# Patient Record
Sex: Male | Born: 1955 | Hispanic: No | Marital: Single | Smoking: Former smoker
Health system: Southern US, Community
[De-identification: ages and names within clinical notes are randomized; demographics above are authoritative.]

## PROBLEM LIST (undated history)

## (undated) DIAGNOSIS — E119 Type 2 diabetes mellitus without complications: Secondary | ICD-10-CM

## (undated) HISTORY — PX: NO PAST SURGERIES: SHX2092

---

## 2000-11-28 ENCOUNTER — Encounter: Payer: Self-pay | Admitting: Internal Medicine

## 2000-11-28 ENCOUNTER — Ambulatory Visit: Admission: RE | Admit: 2000-11-28 | Discharge: 2000-11-28 | Payer: Self-pay

## 2000-12-10 ENCOUNTER — Ambulatory Visit (HOSPITAL_COMMUNITY): Admission: RE | Admit: 2000-12-10 | Discharge: 2000-12-10 | Payer: Self-pay | Admitting: Internal Medicine

## 2000-12-10 ENCOUNTER — Encounter: Payer: Self-pay | Admitting: Internal Medicine

## 2000-12-12 ENCOUNTER — Encounter: Payer: Self-pay | Admitting: Internal Medicine

## 2000-12-12 ENCOUNTER — Ambulatory Visit (HOSPITAL_COMMUNITY): Admission: RE | Admit: 2000-12-12 | Discharge: 2000-12-12 | Payer: Self-pay | Admitting: Internal Medicine

## 2009-02-07 ENCOUNTER — Emergency Department (HOSPITAL_COMMUNITY): Admission: EM | Admit: 2009-02-07 | Discharge: 2009-02-07 | Payer: Self-pay | Admitting: Family Medicine

## 2009-03-09 ENCOUNTER — Emergency Department (HOSPITAL_COMMUNITY): Admission: EM | Admit: 2009-03-09 | Discharge: 2009-03-10 | Payer: Self-pay | Admitting: Family Medicine

## 2009-05-28 ENCOUNTER — Ambulatory Visit: Payer: Self-pay | Admitting: Family Medicine

## 2009-05-28 ENCOUNTER — Encounter: Payer: Self-pay | Admitting: Family Medicine

## 2009-05-28 DIAGNOSIS — E1165 Type 2 diabetes mellitus with hyperglycemia: Secondary | ICD-10-CM

## 2009-05-28 LAB — CONVERTED CEMR LAB
Hgb A1c MFr Bld: 8.4 %
PSA: 0.53 ng/mL (ref 0.10–4.00)

## 2009-10-28 ENCOUNTER — Emergency Department (HOSPITAL_COMMUNITY): Admission: EM | Admit: 2009-10-28 | Discharge: 2009-10-28 | Payer: Self-pay | Admitting: Emergency Medicine

## 2011-02-12 ENCOUNTER — Encounter: Payer: Self-pay | Admitting: *Deleted

## 2011-03-31 LAB — GLUCOSE, CAPILLARY: Glucose-Capillary: 230 mg/dL — ABNORMAL HIGH (ref 70–99)

## 2011-04-03 LAB — POCT I-STAT, CHEM 8
BUN: 10 mg/dL (ref 6–23)
Calcium, Ion: 1.1 mmol/L — ABNORMAL LOW (ref 1.12–1.32)
Chloride: 99 mEq/L (ref 96–112)
Creatinine, Ser: 1 mg/dL (ref 0.4–1.5)
Glucose, Bld: 589 mg/dL (ref 70–99)
HCT: 49 % (ref 39.0–52.0)

## 2011-04-03 LAB — DIFFERENTIAL
Basophils Absolute: 0 10*3/uL (ref 0.0–0.1)
Basophils Relative: 0 % (ref 0–1)
Eosinophils Relative: 1 % (ref 0–5)
Monocytes Absolute: 0.6 10*3/uL (ref 0.1–1.0)
Monocytes Relative: 7 % (ref 3–12)

## 2011-04-03 LAB — GLUCOSE, CAPILLARY
Glucose-Capillary: 219 mg/dL — ABNORMAL HIGH (ref 70–99)
Glucose-Capillary: 586 mg/dL (ref 70–99)

## 2011-04-03 LAB — BASIC METABOLIC PANEL
CO2: 25 mEq/L (ref 19–32)
Chloride: 98 mEq/L (ref 96–112)
Glucose, Bld: 582 mg/dL (ref 70–99)
Potassium: 4 mEq/L (ref 3.5–5.1)
Sodium: 130 mEq/L — ABNORMAL LOW (ref 135–145)

## 2011-04-03 LAB — POCT URINALYSIS DIP (DEVICE)
Bilirubin Urine: NEGATIVE
Glucose, UA: >=1000 mg/dL — AB
Nitrite: NEGATIVE
pH: 5.5 (ref 5.0–8.0)

## 2011-04-03 LAB — HEMOCCULT GUIAC POC 1CARD (OFFICE): Fecal Occult Bld: NEGATIVE

## 2011-04-03 LAB — CBC
HCT: 43.3 % (ref 39.0–52.0)
Hemoglobin: 15.1 g/dL (ref 13.0–17.0)
MCHC: 34.9 g/dL (ref 30.0–36.0)
MCV: 90.5 fL (ref 78.0–100.0)
RDW: 12.7 % (ref 11.5–15.5)

## 2012-01-09 ENCOUNTER — Ambulatory Visit (INDEPENDENT_AMBULATORY_CARE_PROVIDER_SITE_OTHER): Payer: Self-pay | Admitting: Family Medicine

## 2012-01-09 ENCOUNTER — Encounter: Payer: Self-pay | Admitting: Family Medicine

## 2012-01-09 ENCOUNTER — Other Ambulatory Visit: Payer: Self-pay

## 2012-01-09 ENCOUNTER — Ambulatory Visit (HOSPITAL_COMMUNITY)
Admission: RE | Admit: 2012-01-09 | Discharge: 2012-01-09 | Disposition: A | Discharge status: Home / Self Care | Payer: Self-pay | Source: Ambulatory Visit | Attending: Family Medicine | Admitting: Family Medicine

## 2012-01-09 VITALS — BP 117/74 | HR 58 | Ht 65.0 in | Wt 190.3 lb

## 2012-01-09 DIAGNOSIS — M659 Synovitis and tenosynovitis, unspecified: Secondary | ICD-10-CM | POA: Insufficient documentation

## 2012-01-09 DIAGNOSIS — E1169 Type 2 diabetes mellitus with other specified complication: Secondary | ICD-10-CM | POA: Insufficient documentation

## 2012-01-09 DIAGNOSIS — R079 Chest pain, unspecified: Secondary | ICD-10-CM | POA: Insufficient documentation

## 2012-01-09 DIAGNOSIS — B353 Tinea pedis: Secondary | ICD-10-CM | POA: Insufficient documentation

## 2012-01-09 DIAGNOSIS — G629 Polyneuropathy, unspecified: Secondary | ICD-10-CM | POA: Insufficient documentation

## 2012-01-09 DIAGNOSIS — M542 Cervicalgia: Secondary | ICD-10-CM | POA: Insufficient documentation

## 2012-01-09 DIAGNOSIS — R9431 Abnormal electrocardiogram [ECG] [EKG]: Secondary | ICD-10-CM | POA: Insufficient documentation

## 2012-01-09 DIAGNOSIS — E119 Type 2 diabetes mellitus without complications: Secondary | ICD-10-CM

## 2012-01-09 DIAGNOSIS — G589 Mononeuropathy, unspecified: Secondary | ICD-10-CM

## 2012-01-09 LAB — POCT GLYCOSYLATED HEMOGLOBIN (HGB A1C): Hemoglobin A1C: 6.6

## 2012-01-09 LAB — CBC WITH DIFFERENTIAL/PLATELET
Basophils Absolute: 0 10*3/uL (ref 0.0–0.1)
Basophils Relative: 0 % (ref 0–1)
Eosinophils Absolute: 0.1 10*3/uL (ref 0.0–0.7)
HCT: 46.8 % (ref 39.0–52.0)
MCH: 31.3 pg (ref 26.0–34.0)
MCHC: 34 g/dL (ref 30.0–36.0)
Monocytes Absolute: 0.5 10*3/uL (ref 0.1–1.0)
Neutro Abs: 3.8 10*3/uL (ref 1.7–7.7)
Neutrophils Relative %: 53 % (ref 43–77)
RDW: 13.7 % (ref 11.5–15.5)

## 2012-01-09 MED ORDER — GABAPENTIN 300 MG PO CAPS: 300.0000 mg | ORAL_CAPSULE | Freq: Three times a day (TID) | ORAL | Status: DC

## 2012-01-09 MED ORDER — ASPIRIN 81 MG PO CHEW: 81.0000 mg | CHEWABLE_TABLET | Freq: Every day | ORAL | Status: AC

## 2012-01-09 MED ORDER — KETOCONAZOLE 2 % EX CREA: TOPICAL_CREAM | Freq: Every day | CUTANEOUS | Status: AC

## 2012-01-09 NOTE — Assessment & Plan Note (Signed)
Will assess with A1C; also to assess lipids and Cmet.  Not restarting meds at this time, prefer to see labs first.  Will emphasize diet control.

## 2012-01-09 NOTE — Patient Instructions (Signed)
Fue un Research officer, trade union.    El electrocardiograma que hicimos en el consultorio no demuestra ningun trastorno del corazon.  Si vuelve a Photographer en el [employer] le dure mas tiempo o esta' asociado con sudores, nauseas, dificultad en respirar, u otros sintomas, quiero que busque atencion urgentemente.  Tome una aspirina 81mg  todos los dias.  Para los [employer] manos y los pies, creo que estan relacionados con danos a los nervios provocados por la diabetes.  Le estoy recetando una medicina Gabapentin, 100mg , una tableta tres veces por dia, para esto.  Mande' la receta a Nurse, learning disability.  Para los [employer] dedos de los pies, quiero que aplique la crema antiongo que Ramona' a la misma farmacia.   Quiero verle en un mes para ver Cathlean Sauer' .  Le contacto con los resultados o por carta, o le llamo a su hija Ana al [phone].  FOLLOW UP APPT WITH DR Mauricio Po IN 4 TO 5 WEEKS.

## 2012-01-09 NOTE — Assessment & Plan Note (Signed)
I believe pain to be related to neuropathy.  Will begin gabapentin now, to follow up in one month for this.

## 2012-01-09 NOTE — Assessment & Plan Note (Signed)
Topical antifungal between toes and aerating feet at home.

## 2012-01-09 NOTE — Progress Notes (Signed)
  Subjective:    Patient ID: Justin Stein, male    DOB: 1956-01-03, 56 y.o.   MRN: [id number]  HPI Visit conducted in Spanish. Patient here with daughter, Justin Stein.  Was last here in 2010 and diagnosed with DM; had been on metformin but ran out soon thereafter and did not resume.  Moved back to [place], [place], where he lived until 3 months ago when he came back to [facility].  Lives with wife and son.  Main reason for visit is to re-establish care. Reports pain in both hands and feet; burning sensation with tingling, as well as numbness in fingers both hands.  Has not had lesions or sores on feet. Feet feel flushed for the past 3 weeks or so.   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  Never smoker, no alcohol or drugs.  Walks regularly.   Family Hx; Mother living with arthritis ("Reuma").  Father deceased in his 17s, had DM.  No known CAD or any form of cancer in family, specifically asked about colon, prostate and lung cancer.    Review of Systems Denies fevers or chills.  Reports twinges of L sided chest pain that come on at rest and last for a few seconds, are relieved by applying pressure on chest wall with hand. No associated diaphoresis, nausea, dyspnea or palpitations.  This happens for a few seconds while at rest.  Not associated with shoulder movement,. Can walk up a few flights of stairs without pain, dyspnea or diaphoresis.  No bowel changes, no dysuria, no hematochezia.  No leg swelling.     Objective:   Physical Exam Well appearing, no apparent distress.  HEENT Neck supple. Moist mucus membranes. No adenopathy COR S1S2, no extra sounds, no tenderness with chest wall palpation.  PULM Clear bilaterally,. No rales or wheezes ABD Soft, nontender, nondistended MSK" Callouses on hands; some erythema around DIP and PIP joints in both hands; no callor or tumor.  Brisk cap refill in distal fingers.  Palpable radial pulses bilat.  Sensation grossly intact hands; full hand  grip.  Feet: Macerated skin between digits; sensation with monofilament decreased at heels and base of 3rd digits bilaterally. No edema; palpable dp pulses bilaterally Gait unremarkable with assistance.       Assessment & Plan:

## 2012-01-09 NOTE — Assessment & Plan Note (Signed)
ECG with sinus bradycardia, QTc 404, no ischemic changes or Q waves.  Chest pain history does not sound cardiac; given history of DM, will follow.  Recommendation for ASA 81mg  daily.  Discussed red flags and reasons to seek urgent attention.

## 2012-01-09 NOTE — Assessment & Plan Note (Signed)
Patient with apparent active synovitis on exam; mother with "reuma", will check RF today, as well as CBC.

## 2012-01-10 LAB — LIPID PANEL
HDL: 39 mg/dL — ABNORMAL LOW (ref >39)
LDL Cholesterol: 66 mg/dL (ref 0–99)
Total CHOL/HDL Ratio: 3 Ratio
Triglycerides: 61 mg/dL (ref <150)

## 2012-01-10 LAB — COMPREHENSIVE METABOLIC PANEL
ALT: 69 U/L — ABNORMAL HIGH (ref 0–53)
BUN: 13 mg/dL (ref 6–23)
CO2: 23 mEq/L (ref 19–32)
Calcium: 9.6 mg/dL (ref 8.4–10.5)
Chloride: 105 mEq/L (ref 96–112)
Creat: 0.68 mg/dL (ref 0.50–1.35)
Total Bilirubin: 0.8 mg/dL (ref 0.3–1.2)

## 2012-01-10 LAB — SEDIMENTATION RATE: Sed Rate: 1 mm/hr (ref 0–16)

## 2012-01-12 ENCOUNTER — Encounter: Payer: Self-pay | Admitting: Family Medicine

## 2012-02-13 ENCOUNTER — Ambulatory Visit: Payer: Self-pay | Admitting: Family Medicine

## 2012-02-24 ENCOUNTER — Ambulatory Visit (INDEPENDENT_AMBULATORY_CARE_PROVIDER_SITE_OTHER): Payer: Self-pay | Admitting: Family Medicine

## 2012-02-24 ENCOUNTER — Encounter: Payer: Self-pay | Admitting: Family Medicine

## 2012-02-24 DIAGNOSIS — G5602 Carpal tunnel syndrome, left upper limb: Secondary | ICD-10-CM

## 2012-02-24 DIAGNOSIS — E1142 Type 2 diabetes mellitus with diabetic polyneuropathy: Secondary | ICD-10-CM

## 2012-02-24 DIAGNOSIS — M542 Cervicalgia: Secondary | ICD-10-CM

## 2012-02-24 DIAGNOSIS — E119 Type 2 diabetes mellitus without complications: Secondary | ICD-10-CM

## 2012-02-24 DIAGNOSIS — G5600 Carpal tunnel syndrome, unspecified upper limb: Secondary | ICD-10-CM

## 2012-02-24 DIAGNOSIS — G5620 Lesion of ulnar nerve, unspecified upper limb: Secondary | ICD-10-CM

## 2012-02-24 DIAGNOSIS — E1349 Other specified diabetes mellitus with other diabetic neurological complication: Secondary | ICD-10-CM

## 2012-02-24 DIAGNOSIS — E1340 Other specified diabetes mellitus with diabetic neuropathy, unspecified: Secondary | ICD-10-CM

## 2012-02-24 MED ORDER — GABAPENTIN 100 MG PO CAPS: 300.0000 mg | ORAL_CAPSULE | Freq: Three times a day (TID) | ORAL | Status: DC

## 2012-02-24 NOTE — Assessment & Plan Note (Signed)
Patietn with apparent chest wall pain that is contiguous with cervical discomfort on L, some limitations in L shoulder internal rotation.

## 2012-02-24 NOTE — Assessment & Plan Note (Signed)
Well controlled as evidenced by A1C at last visit. To continue with lifestyle modifications.  Encouraged to take the ASA 81mg  daily, which he has not been taking.  To revisit A1C again after he comes back from [place], [place] in 3 months.

## 2012-02-24 NOTE — Progress Notes (Signed)
  Subjective:    Patient ID: Justin Stein, male    DOB: 12-Jan-1956, 56 y.o.   MRN: [id number]  HPI Visit in SPanish.   Patient here with his daughter, Justin Stein.  He reports that the gabapentin 300mg  tid helped the tingling in his hands/fingers a lot; ran out 2 weeks ago and the sensation has come back.  In the past 2 weeks he has also had a similar sensation in the left chest, radiating from/to the left cervical area and left index/middle fingers.  Feels a tender notch along the medial aspect of his left elbow, which causes shooting pains down forearm.  No change in activity; does not drive long distances or rest elbows on armrest for long periods.  The L elbow pain is worse when he wakes up, gets better as he hangs the left arm off the edge of the bed.   Denies diaphoresis, denies shortness of breath or nausea with this.    Smoking history: remote; quit several years ago.  Is planning to return for visit to [place], [place], in 2 weeks, for a 3 to 4 month stay.     Review of Systems See HPI    Objective:   Physical Exam Well appearing, no apparent distress HEENT Neck supple. Negative Spurlings (sp?) maneuver. Acanthosis nigricans along neckline.  MSK: Able to raise both arms over head to midline actively.  Mild limitation to internal rotation of left arm when reaching to touch between scapulae.  FUll handgrip bilaterally; Sensation in fingers grossly full and symmetric.  NOTABLE POSITIVE TINNELS TEST ON LEFT MEDIAN NERVE.  Tenderness to palpate ulnar notch on left elbow. Full active ROM L elbow.        Assessment & Plan:

## 2012-02-24 NOTE — Patient Instructions (Signed)
Fue un Research officer, trade union.   Creo que los dolores en la mano izquierda y el brazo/hombro representan problemas musculoesqueletales y no cardiacos.    Para la [place], quiero que se ponga el soporte a la [place] cada noche antes de [place], y quiteselo al Designer, multimedia.   Envuelva el codo izquierdo en una toalla de bano antes de [place], para prevenir que se doble el codo mientras duerme.   Mande' una nueva receta para su medicina al [facility] [place].  La nueva receta debe costarle $12 para 270 capsulas de 100mg  (las anteriores eran de 300mg  cada una).  TOME 3 CAPSULAS POR BOCA, TRES VECES POR DIA.  Probablemente esta medicina se consigue mas barata en [place].   Por favor vuelva a verme de nuevo en 4 meses cuando regrese de [place].

## 2013-08-03 ENCOUNTER — Telehealth: Payer: Self-pay | Admitting: *Deleted

## 2013-08-03 NOTE — Telephone Encounter (Signed)
Letter sent regarding diabetes follow up care Elizabeth Jillann Charette, RN-BSN   

## 2013-11-02 ENCOUNTER — Emergency Department (INDEPENDENT_AMBULATORY_CARE_PROVIDER_SITE_OTHER)
Admission: EM | Admit: 2013-11-02 | Discharge: 2013-11-02 | Disposition: A | Discharge status: Home / Self Care | Payer: Self-pay | Source: Home / Self Care | Attending: Emergency Medicine | Admitting: Emergency Medicine

## 2013-11-02 ENCOUNTER — Encounter (HOSPITAL_COMMUNITY): Payer: Self-pay | Admitting: Emergency Medicine

## 2013-11-02 ENCOUNTER — Emergency Department (INDEPENDENT_AMBULATORY_CARE_PROVIDER_SITE_OTHER): Payer: Self-pay

## 2013-11-02 DIAGNOSIS — M5416 Radiculopathy, lumbar region: Secondary | ICD-10-CM

## 2013-11-02 DIAGNOSIS — IMO0002 Reserved for concepts with insufficient information to code with codable children: Secondary | ICD-10-CM

## 2013-11-02 DIAGNOSIS — M779 Enthesopathy, unspecified: Secondary | ICD-10-CM

## 2013-11-02 DIAGNOSIS — R51 Headache: Secondary | ICD-10-CM

## 2013-11-02 HISTORY — DX: Type 2 diabetes mellitus without complications: E11.9

## 2013-11-02 LAB — POCT I-STAT, CHEM 8
BUN: 14 mg/dL (ref 6–23)
Creatinine, Ser: 0.9 mg/dL (ref 0.50–1.35)
Glucose, Bld: 190 mg/dL — ABNORMAL HIGH (ref 70–99)
Hemoglobin: 17 g/dL (ref 13.0–17.0)
Sodium: 138 mEq/L (ref 135–145)
TCO2: 23 mmol/L (ref 0–100)

## 2013-11-02 LAB — CBC WITH DIFFERENTIAL/PLATELET
Basophils Absolute: 0 10*3/uL (ref 0.0–0.1)
Eosinophils Relative: 1 % (ref 0–5)
HCT: 46.3 % (ref 39.0–52.0)
Hemoglobin: 16.4 g/dL (ref 13.0–17.0)
Lymphocytes Relative: 35 % (ref 12–46)
Lymphs Abs: 3.2 10*3/uL (ref 0.7–4.0)
MCV: 89 fL (ref 78.0–100.0)
Monocytes Absolute: 0.5 10*3/uL (ref 0.1–1.0)
Monocytes Relative: 6 % (ref 3–12)
Neutro Abs: 5.4 10*3/uL (ref 1.7–7.7)
RBC: 5.2 MIL/uL (ref 4.22–5.81)
WBC: 9.2 10*3/uL (ref 4.0–10.5)

## 2013-11-02 LAB — POCT URINALYSIS DIP (DEVICE)
Hgb urine dipstick: NEGATIVE
Ketones, ur: NEGATIVE mg/dL
Protein, ur: NEGATIVE mg/dL
Specific Gravity, Urine: 1.015 (ref 1.005–1.030)
Urobilinogen, UA: 0.2 mg/dL (ref 0.0–1.0)

## 2013-11-02 MED ORDER — MELOXICAM 15 MG PO TABS: 15.0000 mg | ORAL_TABLET | Freq: Every day | ORAL | Status: DC

## 2013-11-02 MED ORDER — TRAMADOL HCL 50 MG PO TABS: 100.0000 mg | ORAL_TABLET | Freq: Three times a day (TID) | ORAL | Status: DC | PRN

## 2013-11-02 MED ORDER — KETOROLAC TROMETHAMINE 60 MG/2ML IM SOLN
INTRAMUSCULAR | Status: AC
  Filled 2013-11-02: qty 2

## 2013-11-02 MED ORDER — KETOROLAC TROMETHAMINE 60 MG/2ML IM SOLN
60.0000 mg | Freq: Once | INTRAMUSCULAR | Status: AC
  Administered 2013-11-02: 60 mg via INTRAMUSCULAR

## 2013-11-02 NOTE — ED Notes (Addendum)
Pt c/o intermittent pain on right flank side onset 1 week and headache on left side Pain also on right thigh and right shoulder  Pain increases w/activity and when he bends over Denies: inj/trauma, f/v/n/d, constipation, SOB, blurry vision Alert w/no signs of acute distress.

## 2013-11-02 NOTE — ED Provider Notes (Signed)
Chief Complaint:   Chief Complaint  Patient presents with  . Flank Pain    History of Present Illness:   Justin Stein is a 57 year old male type II diabetic who presents with a two-week history of right flank pain which radiates down his entire right leg, right wrist pain, and headache. He speaks limited [place] and his daughter served as a family interpreter. The right flank pain is located in the back with radiation to the buttock and down the leg. There is numbness and tingling in the leg but no weakness. He apparently has had this before. He takes gabapentin. There no bladder or bowel symptoms, no saddle anesthesia, and no abdominal pain. He also has pain in his right wrist, and his hand feels numb. There is no weakness or swelling of the hand. He's also had a bitemporal headache. No fevers, chills, visual symptoms, sore throat, stiff neck, chest pain, shortness of breath, abdominal pain, nausea, vomiting, or diarrhea. He denies any urinary symptoms.  Review of Systems:  Other than noted above, the patient denies any of the following symptoms. Systemic:  No fever, chills, sweats, fatigue, myalgias, headache, or anorexia. Eye:  No redness, pain or drainage. ENT:  No earache, nasal congestion, rhinorrhea, sinus pressure, or sore throat. Lungs:  No cough, sputum production, wheezing, shortness of breath.  Cardiovascular:  No chest pain, palpitations, or syncope. GI:  No nausea, vomiting, abdominal pain or diarrhea. GU:  No dysuria, frequency, or hematuria. Skin:  No rash or pruritis.  PMFSH:  Past medical history, family history, social history, meds, and allergies were reviewed.  He has a history of diabetes. He is not taking any medications for this right now.  Physical Exam:   Vital signs:  BP 131/82  Pulse 60  Temp(Src) 97.3 F (36.3 C) (Oral)  Resp 18  SpO2 96% General:  Alert, in no distress. Eye:  PERRL, full EOMs.  Lids and conjunctivas were normal. ENT:  TMs and canals  were normal, without erythema or inflammation.  Nasal mucosa was clear and uncongested, without drainage.  Mucous membranes were moist.  Pharynx was clear, without exudate or drainage.  There were no oral ulcerations or lesions. Neck:  Supple, no adenopathy, tenderness or mass. Thyroid was normal. Lungs:  No respiratory distress.  Lungs were clear to auscultation, without wheezes, rales or rhonchi.  Breath sounds were clear and equal bilaterally. Heart:  Regular rhythm, without gallops, murmers or rubs. Abdomen:  Soft, flat, and non-tender to palpation.  No hepatosplenomagaly or mass. Back: There is tenderness to palpation in the entire right flank area with radiation to the buttock. Straight leg raising was negative. Extremities: Pulses full, no edema or swelling, there was tenderness to palpation over the right wrist particularly in the carpal, area. Neuro exam: Normal DTRs and muscle strength. Cranial nerves were intact. Skin:  Clear, warm, and dry, without rash or lesions.  Labs:   Results for orders placed during the hospital encounter of 11/02/13  CBC WITH DIFFERENTIAL      Result Value Range   WBC 9.2  4.0 - 10.5 K/uL   RBC 5.20  4.22 - 5.81 MIL/uL   Hemoglobin 16.4  13.0 - 17.0 g/dL   HCT 96.0  45.4 - 09.8 %   MCV 89.0  78.0 - 100.0 fL   MCH 31.5  26.0 - 34.0 pg   MCHC 35.4  30.0 - 36.0 g/dL   RDW 11.9  14.7 - 82.9 %   Platelets 199  150 -  400 K/uL   Neutrophils Relative % 58  43 - 77 %   Neutro Abs 5.4  1.7 - 7.7 K/uL   Lymphocytes Relative 35  12 - 46 %   Lymphs Abs 3.2  0.7 - 4.0 K/uL   Monocytes Relative 6  3 - 12 %   Monocytes Absolute 0.5  0.1 - 1.0 K/uL   Eosinophils Relative 1  0 - 5 %   Eosinophils Absolute 0.1  0.0 - 0.7 K/uL   Basophils Relative 0  0 - 1 %   Basophils Absolute 0.0  0.0 - 0.1 K/uL  POCT URINALYSIS DIP (DEVICE)      Result Value Range   Glucose, UA >=1000 (*) NEGATIVE mg/dL   Bilirubin Urine NEGATIVE  NEGATIVE   Ketones, ur NEGATIVE  NEGATIVE  mg/dL   Specific Gravity, Urine 1.015  1.005 - 1.030   Hgb urine dipstick NEGATIVE  NEGATIVE   pH 7.0  5.0 - 8.0   Protein, ur NEGATIVE  NEGATIVE mg/dL   Urobilinogen, UA 0.2  0.0 - 1.0 mg/dL   Nitrite NEGATIVE  NEGATIVE   Leukocytes, UA NEGATIVE  NEGATIVE  POCT I-STAT, CHEM 8      Result Value Range   Sodium 138  135 - 145 mEq/L   Potassium 3.9  3.5 - 5.1 mEq/L   Chloride 102  96 - 112 mEq/L   BUN 14  6 - 23 mg/dL   Creatinine, Ser 1.61  0.50 - 1.35 mg/dL   Glucose, Bld 096 (*) 70 - 99 mg/dL   Calcium, Ion 0.45  4.09 - 1.23 mmol/L   TCO2 23  0 - 100 mmol/L   Hemoglobin 17.0  13.0 - 17.0 g/dL   HCT 81.1  91.4 - 78.2 %     Radiology:  Dg Abd Acute W/chest  11/02/2013   CLINICAL DATA:  Right flank pain for 2 weeks  EXAM: ACUTE ABDOMEN SERIES (ABDOMEN 2 VIEW & CHEST 1 VIEW)  COMPARISON:  Report from CT abdomen December 2001  FINDINGS: Normal heart, mediastinal, and hilar contours. Midline trachea. The lungs are clear.  Negative for free intraperitoneal air. Bowel gas pattern is nonobstructive. No radiopaque urinary tract calculus is visualized in the abdomen or pelvis. No acute or suspicious osseous abnormality is identified.  IMPRESSION: Negative abdominal radiographs.  No acute cardiopulmonary disease.   Electronically Signed   By: Britta Mccreedy M.D.   On: 11/02/2013 14:29   Course in Urgent Care Center:   He was given Toradol 60 mg IM and placed in a wrist splint.  Assessment:  The primary encounter diagnosis was Lumbar radiculopathy. Diagnoses of Tendonitis and Headache were also pertinent to this visit.  Plan:   1.  Meds:  The following meds were prescribed:   Discharge Medication List as of 11/02/2013  2:44 PM    START taking these medications   Details  meloxicam (MOBIC) 15 MG tablet Take 1 tablet (15 mg total) by mouth daily., Starting 11/02/2013, Until Discontinued, Normal    traMADol (ULTRAM) 50 MG tablet Take 2 tablets (100 mg total) by mouth every 8 (eight) hours as  needed., Starting 11/02/2013, Until Discontinued, Normal        2.  Patient Education/Counseling:  The patient was given appropriate handouts, self care instructions, and instructed in symptomatic relief.  She was begun on back exercises and carpal tunnel exercises.  3.  Follow up:  The patient was told to follow up if no better in 3 to 4  days, if becoming worse in any way, and given some red flag symptoms such as increasing pain or new neurological symptoms which would prompt immediate return.  Follow up followup with Dr. Mauricio Po, his primary care physician in 2 weeks.         Reuben Likes, MD 11/02/13 2202

## 2013-11-11 ENCOUNTER — Encounter: Payer: Self-pay | Admitting: Family Medicine

## 2013-11-11 ENCOUNTER — Ambulatory Visit (INDEPENDENT_AMBULATORY_CARE_PROVIDER_SITE_OTHER): Payer: Self-pay | Admitting: Family Medicine

## 2013-11-11 VITALS — BP 120/75 | HR 58 | Ht 63.0 in | Wt 189.0 lb

## 2013-11-11 DIAGNOSIS — E119 Type 2 diabetes mellitus without complications: Secondary | ICD-10-CM

## 2013-11-11 DIAGNOSIS — G5620 Lesion of ulnar nerve, unspecified upper limb: Secondary | ICD-10-CM

## 2013-11-11 DIAGNOSIS — E1349 Other specified diabetes mellitus with other diabetic neurological complication: Secondary | ICD-10-CM

## 2013-11-11 DIAGNOSIS — E1340 Other specified diabetes mellitus with diabetic neuropathy, unspecified: Secondary | ICD-10-CM

## 2013-11-11 DIAGNOSIS — Z23 Encounter for immunization: Secondary | ICD-10-CM

## 2013-11-11 LAB — LIPID PANEL
Cholesterol: 146 mg/dL (ref 0–200)
HDL: 45 mg/dL (ref >39)
LDL Cholesterol: 84 mg/dL (ref 0–99)
Total CHOL/HDL Ratio: 3.2 Ratio

## 2013-11-11 MED ORDER — METFORMIN HCL 500 MG PO TABS: 500.0000 mg | ORAL_TABLET | Freq: Every day | ORAL | Status: DC

## 2013-11-11 MED ORDER — MELOXICAM 15 MG PO TABS: 15.0000 mg | ORAL_TABLET | Freq: Every day | ORAL | Status: DC

## 2013-11-11 NOTE — Assessment & Plan Note (Signed)
Patient with DM2, A1C today is 7.4%.  Does not exhibit any of the symptoms of hyperglycemia; A1c is above the goal of <7%.  To add metformin 500mg  at breakfast one time daily, discussed side effects and asked patient to communicate these if not tolerated.  His BP is exceptionally well controlled, for this reason I am not rushing to start on ACEI.  For urine microalbumin at next visit.  May consider small dose of lisinopril; recommended he continue his ASA 81mg  daily for cardiac protection.  For retinal scan at next visit as well.

## 2013-11-11 NOTE — Assessment & Plan Note (Signed)
Resolved, no longer on gabapentin.  Plan to d/c gabapentin from med list today.

## 2013-11-11 NOTE — Progress Notes (Signed)
  Subjective:    Patient ID: Justin Stein, male    DOB: 07-Apr-1956, 57 y.o.   MRN: [id number]  HPI Patient here with wife and daughter, visit conducted in [place].  Here for follow up of DM.  Recently seen for radicular low back pain, which has improved with meloxicam and intermittent ultram use.  He is not taking any other medications.  Last visit for his DM was in the first half of 2013.   His back pain has completely resolved.  He is not having any pain, weakness of numbness.   ROS: Denies any polyuria, polydipsia, vision change, weight loss, fever/chills, cough, chest discomfort, swelling or pain in feet or arms.  Previously had taken Gabapentin for dx of neuropathy of feet, but has not taken in awhile and no longer has symptoms.   Social Hx Does not smoke (former smoker, many years ago). No alcohol.  Traveling to [place], [place] for Christmas holidays, planning to leave in mid-December (around the 12th).    Review of Systems     Objective:   Physical Exam Well appearing, no apparent distress.  HEENT Neck supple. Moist mucus membranes. Clear oropharynx. NO cervical adenopathy. No thyroid nodules or masses.  COR Regular S1S2, no extra sounds PULM Clear bilaterally, no rales or wheezes ABD Soft, nontender, nO CVA tenderness.  MSK: No CVA tenderness or tenderness along paraspinous mm of lower back.  Full strength with flexion of hips bilat.   EXTS: no edema in feet; mild areas of abrasion along dorsum of 3rd toes bilat without break in skin. No maceration of skin between toes. Sensation grossly intact bilaterally.        Assessment & Plan:

## 2013-11-11 NOTE — Patient Instructions (Signed)
Fue un placer verle de nuevo hoy; le felicito por el control del IT consultant (el estudio "hemoglobina glicosilada", o "A1C" salio' en 7.4%).  Para la diabetes, quiero que tome la METFORMIN 500mg , una tableta por boca, una vez por dia con el desayuno.  Mande' la receta a [facility] en [address] por 90 dias.   QUiero que siga tomando una aspirina 81mg  por dia.   Vamos a hacerle un chequeo de la retina (fondo del ojo) en la proxima visita.   Le estoy chequeando el perfil lipidico hoy; le mando una carta con los [place].   NEEDS DM RETINAL EXAM; FLU SHOT TODAY.  FOLLOW UP 3 MONTHS.

## 2013-11-13 ENCOUNTER — Encounter: Payer: Self-pay | Admitting: Family Medicine

## 2013-12-02 ENCOUNTER — Ambulatory Visit: Payer: Self-pay | Admitting: Family Medicine

## 2014-09-16 IMAGING — CR DG ABDOMEN ACUTE W/ 1V CHEST
3 series · 3 of 3 positions shown · non-contrast
Comparison: Report from CT abdomen November 2000

CLINICAL DATA: Right flank pain for 2 weeks

EXAM:
ACUTE ABDOMEN SERIES (ABDOMEN 2 VIEW & CHEST 1 VIEW)

[view not recorded (1 of 3)]
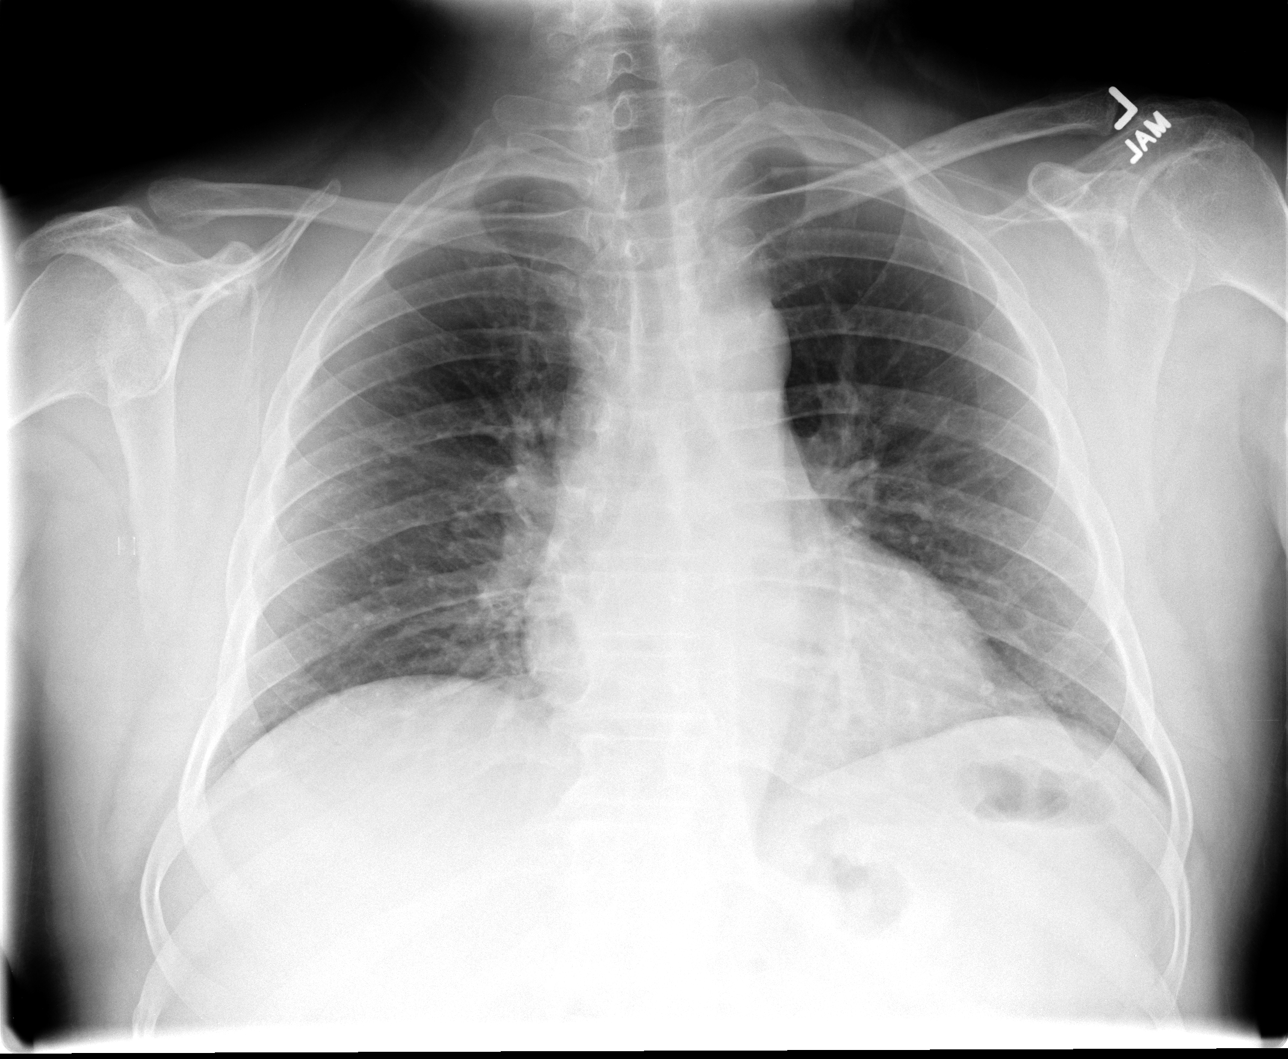

[view not recorded (2 of 3)]
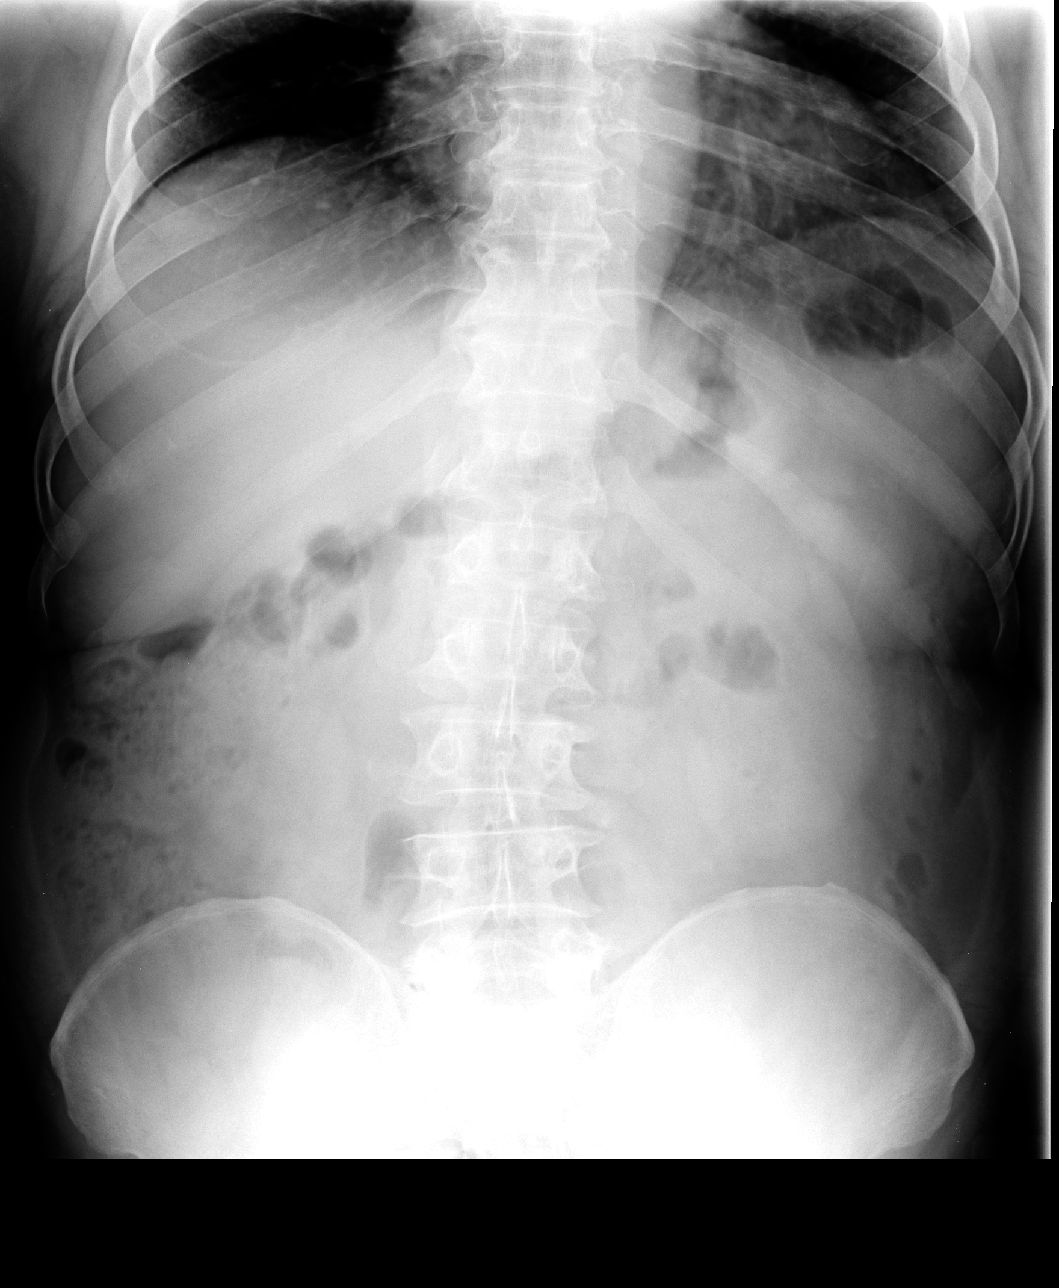

[view not recorded (3 of 3)]
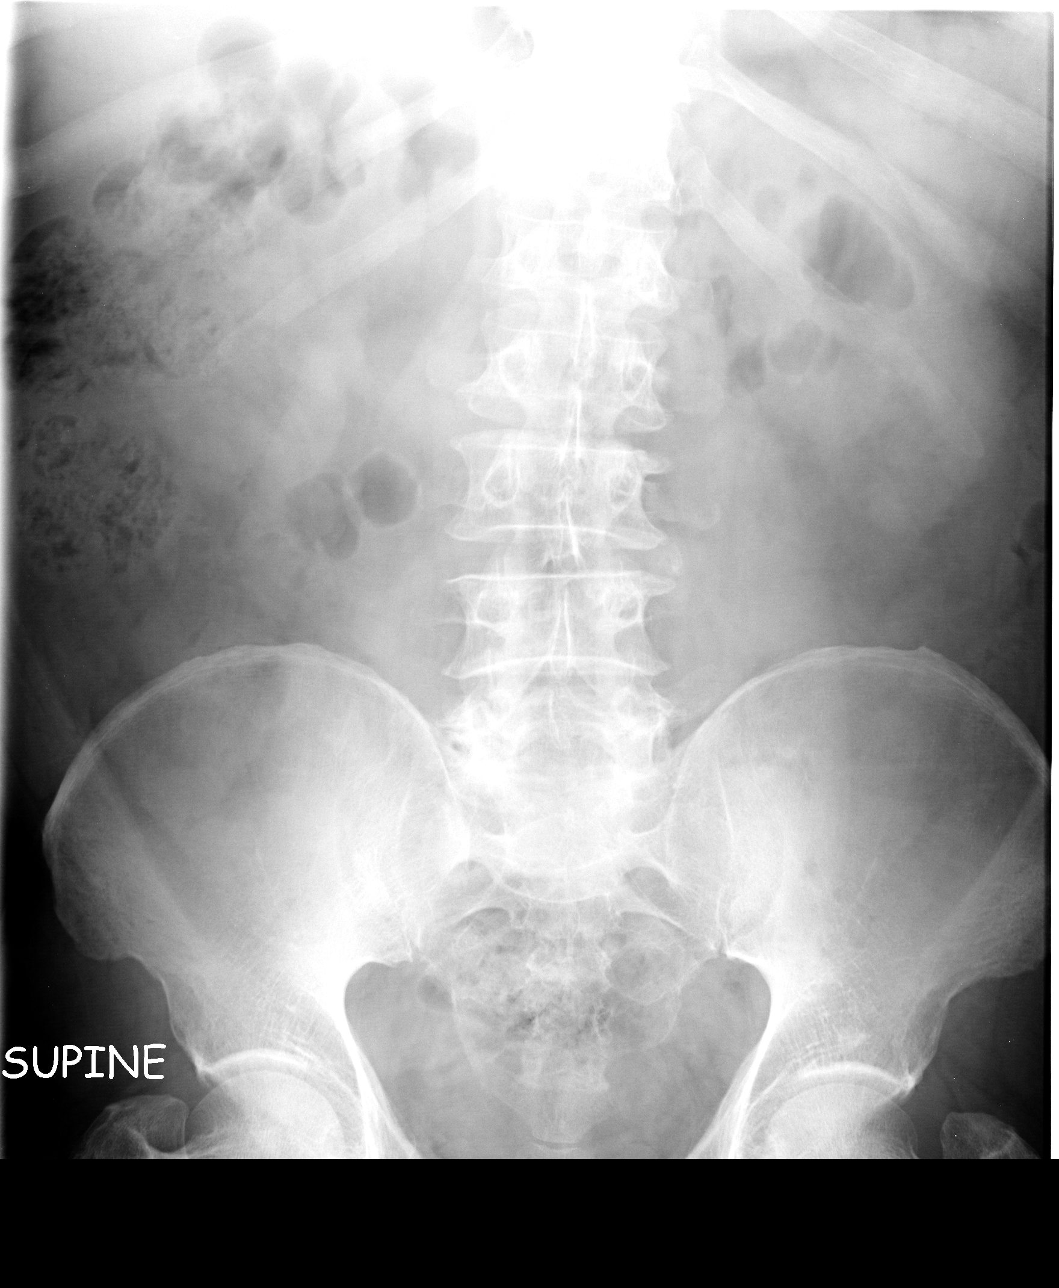

[3 of 3 positions shown; findings below may reference images not displayed]

FINDINGS: Normal heart, mediastinal, and hilar contours. Midline trachea. The
lungs are clear.

Negative for free intraperitoneal air. Bowel gas pattern is
nonobstructive. No radiopaque urinary tract calculus is visualized
in the abdomen or pelvis. No acute or suspicious osseous abnormality
is identified.
IMPRESSION: Negative abdominal radiographs.  No acute cardiopulmonary disease.

## 2015-01-30 ENCOUNTER — Ambulatory Visit (INDEPENDENT_AMBULATORY_CARE_PROVIDER_SITE_OTHER): Payer: Self-pay | Admitting: Family Medicine

## 2015-01-30 ENCOUNTER — Encounter: Payer: Self-pay | Admitting: Family Medicine

## 2015-01-30 VITALS — BP 126/80 | HR 60 | Temp 98.4°F | Ht 63.0 in | Wt 179.0 lb

## 2015-01-30 DIAGNOSIS — E1165 Type 2 diabetes mellitus with hyperglycemia: Secondary | ICD-10-CM

## 2015-01-30 DIAGNOSIS — N4889 Other specified disorders of penis: Secondary | ICD-10-CM

## 2015-01-30 DIAGNOSIS — IMO0002 Reserved for concepts with insufficient information to code with codable children: Secondary | ICD-10-CM

## 2015-01-30 DIAGNOSIS — G629 Polyneuropathy, unspecified: Secondary | ICD-10-CM

## 2015-01-30 DIAGNOSIS — B353 Tinea pedis: Secondary | ICD-10-CM

## 2015-01-30 DIAGNOSIS — N489 Disorder of penis, unspecified: Secondary | ICD-10-CM | POA: Insufficient documentation

## 2015-01-30 DIAGNOSIS — E119 Type 2 diabetes mellitus without complications: Secondary | ICD-10-CM

## 2015-01-30 LAB — CBC
HCT: 47.8 % (ref 39.0–52.0)
Hemoglobin: 16.4 g/dL (ref 13.0–17.0)
MCH: 30.8 pg (ref 26.0–34.0)
MCHC: 34.3 g/dL (ref 30.0–36.0)
MCV: 89.8 fL (ref 78.0–100.0)
MPV: 10.7 fL (ref 8.6–12.4)
Platelets: 219 10*3/uL (ref 150–400)
RBC: 5.32 MIL/uL (ref 4.22–5.81)
RDW: 13.2 % (ref 11.5–15.5)
WBC: 6.9 10*3/uL (ref 4.0–10.5)

## 2015-01-30 LAB — POCT URINALYSIS DIPSTICK
Bilirubin, UA: NEGATIVE
Blood, UA: NEGATIVE
GLUCOSE UA: 500
KETONES UA: NEGATIVE
LEUKOCYTES UA: NEGATIVE
NITRITE UA: NEGATIVE
Protein, UA: NEGATIVE
SPEC GRAV UA: 1.015
UROBILINOGEN UA: 0.2
pH, UA: 7

## 2015-01-30 LAB — BASIC METABOLIC PANEL
BUN: 11 mg/dL (ref 6–23)
CHLORIDE: 105 meq/L (ref 96–112)
CO2: 24 meq/L (ref 19–32)
CREATININE: 0.73 mg/dL (ref 0.50–1.35)
Calcium: 9.4 mg/dL (ref 8.4–10.5)
Glucose, Bld: 191 mg/dL — ABNORMAL HIGH (ref 70–99)
Potassium: 4.1 mEq/L (ref 3.5–5.3)
Sodium: 133 mEq/L — ABNORMAL LOW (ref 135–145)

## 2015-01-30 LAB — LIPID PANEL
CHOLESTEROL: 131 mg/dL (ref 0–200)
HDL: 43 mg/dL (ref >39)
LDL Cholesterol: 69 mg/dL (ref 0–99)
TRIGLYCERIDES: 94 mg/dL (ref <150)
Total CHOL/HDL Ratio: 3 Ratio
VLDL: 19 mg/dL (ref 0–40)

## 2015-01-30 LAB — POCT GLYCOSYLATED HEMOGLOBIN (HGB A1C): HEMOGLOBIN A1C: 8.1

## 2015-01-30 MED ORDER — METFORMIN HCL 500 MG PO TABS: 500.0000 mg | ORAL_TABLET | Freq: Every day | ORAL | Status: DC

## 2015-01-30 MED ORDER — CLOTRIMAZOLE 1 % EX CREA: 1.0000 "application " | TOPICAL_CREAM | Freq: Two times a day (BID) | CUTANEOUS | Status: DC

## 2015-01-30 NOTE — Patient Instructions (Signed)
Fue un Research officer, trade unionplacer verle hoy.   Para la roncha en la cabeza del pene, use la crema CLOTRIMAZOLE 2 veces por dia, por 2 semanas.  Puede usarla [employer] Office Solutionstambien entre los dedos de los pies para tratar el hongo que tiene Harrah's Entertainmenten los pies.   Estamos chequeando los laboratorios hoy en ayunas, le respondo con [place] resultados.   Quiero volver a verle en un mes para seguimiento.  FOLLOW UP IN 1 MONTH FOR DM FOLLOW UP.

## 2015-01-31 ENCOUNTER — Encounter: Payer: Self-pay | Admitting: Family Medicine

## 2015-01-31 NOTE — Assessment & Plan Note (Signed)
Patient with uncontrolled DM; off meds for some time.  Denies symptoms associated with hyperglycemia.  Discussed importance of maintaining regular follow-up and monitoring for DM and associated comorbidities.  Labs assessment today, follow up.

## 2015-01-31 NOTE — Assessment & Plan Note (Signed)
Circumscribed red lesion on head of penis, uncircumcised. Suspect dermatophyte. Trial empiric clotrimazole and follow up.

## 2015-01-31 NOTE — Assessment & Plan Note (Signed)
Treatment with clotrimazole bid.  For follow up.

## 2015-01-31 NOTE — Assessment & Plan Note (Signed)
Resolved since patient's previous visit. Not taking gabapentin.  Removed from medication list.

## 2015-01-31 NOTE — Progress Notes (Signed)
   Subjective:    Patient ID: Justin Stein, male    DOB: 1956/02/06, 59 y.o.   MRN: [id number]  HPI Visit conducted in Spanish.  Patient for follow up of DM, also with complaint of a lesion seen on the head of the penis. No dysuria, no penile discharge.  He is married, denies adamantly any sexual relations outside of his marital relationship.  No prior STIs.  Onset of redness 2-3 days ago.  Has not tried anything for it.   Regarding DM, he has been out of medications for a long while. Had returned to [place] until recently, did not follow up with physician there. Denies fevers/chills, no chest pain or shortness of breath.    Reports some bitemporal headaches from time to time; not associated with photophobia. Eyes burn some times.  Occasional nausea with the headaches. No vision changes. Do not waken him from sleep, may come on after he wakes up.  No vomiting or head trauma.    Review of Systems     Objective:   Physical Exam  Well appearing, no apparent distress HEENT neck supple, no cervical adenopathy COR Regular S1S2, no extra sounds PULM Clear bilaterally, no rales or wheezes  GU: well circumscribed area of erythema along head of penis, uncircumcised.  No meatal redness.  EXTS scaly skin along feet; maceration of skin between toes in all digits of feet.  Palpable dp pulses, monofilament testing is intact in both feet in all areas today.       Assessment & Plan:

## 2015-03-02 ENCOUNTER — Ambulatory Visit (INDEPENDENT_AMBULATORY_CARE_PROVIDER_SITE_OTHER): Payer: Self-pay | Admitting: Family Medicine

## 2015-03-02 ENCOUNTER — Encounter: Payer: Self-pay | Admitting: Family Medicine

## 2015-03-02 VITALS — BP 124/79 | HR 58 | Temp 97.5°F | Ht 63.0 in | Wt 141.0 lb

## 2015-03-02 DIAGNOSIS — E1165 Type 2 diabetes mellitus with hyperglycemia: Secondary | ICD-10-CM

## 2015-03-02 DIAGNOSIS — G5602 Carpal tunnel syndrome, left upper limb: Secondary | ICD-10-CM

## 2015-03-02 DIAGNOSIS — N4889 Other specified disorders of penis: Secondary | ICD-10-CM

## 2015-03-02 DIAGNOSIS — N489 Disorder of penis, unspecified: Secondary | ICD-10-CM

## 2015-03-02 DIAGNOSIS — IMO0002 Reserved for concepts with insufficient information to code with codable children: Secondary | ICD-10-CM

## 2015-03-02 MED ORDER — MELOXICAM 15 MG PO TABS: 15.0000 mg | ORAL_TABLET | Freq: Every day | ORAL | Status: DC

## 2015-03-02 MED ORDER — ATORVASTATIN CALCIUM 40 MG PO TABS: 40.0000 mg | ORAL_TABLET | Freq: Every day | ORAL | Status: DC

## 2015-03-02 MED ORDER — CLOTRIMAZOLE 1 % EX CREA: 1.0000 "application " | TOPICAL_CREAM | Freq: Two times a day (BID) | CUTANEOUS | Status: DC

## 2015-03-02 NOTE — Assessment & Plan Note (Signed)
Resolved on exam today (March 02, 2015). No need for further clotrimazole.

## 2015-03-02 NOTE — Assessment & Plan Note (Signed)
Bilateral positive Tinnel sign with tingling in both hands fingers 1st to 3rd. Discussed use of cock-up splints at night, to use meloxicam one time daily as well.

## 2015-03-02 NOTE — Patient Instructions (Signed)
Fue un Research officer, trade unionplacer verle hoy.   Para la diabetes, quiero que continue tomando la metformin 500mg  una pastilla por dia, y la aspirina 81mg  diaria.   PARA EL COLESTEROL, comience a tomar la ATORVASTATIN 40MG  una tableta cada noche antes de acostarse.   Para las flemas, puede tomar CETIRIZINE 10mg  una tableta cada manana, se puede comprar sin receta.  No interfiere con las demas medicinas que esta' tomando.   PARA LAS MUNECAS, quiero que compre unos soportes que en ingles se llaman de COCK-UP SPLINTS, LEFT AND RIGHT WRISTS.  Uselos en las noches nada mas, y quiteselos en las [place]anas.  Tambien una receta de MELOXICAM 15mg  UNA TABLETA CADA DIA CON EL DESAYUNO (algo en el estomago).   DEBE DE CHEQUEARSE CON EL OCULISTA UNA VEZ POR ANO.   CLOTRIMAZOLE crema entre los dedos de los pies para pie de atleta.   Volver en 2 meses para seguimiento.   FOLLOW UP IN 2 MONTHS FOR REPEAT CHECK OF DM.

## 2015-03-02 NOTE — Progress Notes (Signed)
   Subjective:    Patient ID: Justin Stein, male    DOB: February 04, 1956, 59 y.o.   MRN: [id number]  HPIVisit conducted in Spanish. Patient's daughter Justin Stein is present for the visit.   Follow up on several issues from Feb visit:  1. DM-- he continues to take metformin 500mg  one time daily; no adverse effects. Not missing doses. Also taking ASA 81mg  one time daily.  Has had some tingling along the right foot, consistent with prior episodes. Carries a dx of neuropathy from prior visits.  Denies visual changes.  Has not had a dilated eye exam recently that he can remember.   2. Head congestion with phlegm production in the past 2 weeks. Not significant cough, but does produce phlegm, non-bloody. Not a smoker in many, many years. Not feeling 'ill'. No fevers or chills.   3. Penile irritation went away completely with the topical clotrimazole cream. No dysuria, no penile discharge.   4. Complains of feeling his hands to be swollen and bothersome when he moves his wrists bilat.  He is right-hand dominant. No trauma or injury. Not working now. Does carry his grandchildren, but not worse with this.       Review of Systems     Objective:   Physical Exam Well appaering, no apparent distress HEENT Neck supple no cervical adenopathy. TMs clear bilaterally. Injected conjunctivae with watery rhinorrhea present. Clear orophaynx. No frontal or maxillary sinus tenderness.  COR Regular S1S2, no extra sounds PULM Clear bilaterally, no rales or wheezes GU Normal uncircumcised male; no erythema on head of penis, no urethral meatus redness.  EXTS monofilament testing of both feet, with good sensation in all areas.  Mild erosion of dorsum of 4th toe on left foot, no rupture of skin. Palpable dp pulses bilaterally.  Radial pulses in both wrists; sensation in both hands full, strength (handgrip) full bilat and symmetric.  Brisk capillary refill in fingers of both hands. Tinnels positive bilaterally.          Assessment & Plan:

## 2015-03-02 NOTE — Assessment & Plan Note (Signed)
Patient advised to continue metformin daily, ASA daily.  Applied values to ASCVD Risk calculator, shows 10 year risk of 11.6% which is indication for moderate-high dose statin. GoodRx coupon for atorvastatin 40mg  nightly given with Rx.

## 2015-04-18 ENCOUNTER — Ambulatory Visit: Payer: Self-pay | Admitting: Family Medicine

## 2016-02-11 ENCOUNTER — Encounter: Payer: Self-pay | Admitting: Family Medicine

## 2016-02-11 ENCOUNTER — Ambulatory Visit (INDEPENDENT_AMBULATORY_CARE_PROVIDER_SITE_OTHER): Payer: Self-pay | Admitting: Family Medicine

## 2016-02-11 VITALS — BP 134/80 | HR 66 | Temp 97.9°F | Wt 170.0 lb

## 2016-02-11 DIAGNOSIS — IMO0002 Reserved for concepts with insufficient information to code with codable children: Secondary | ICD-10-CM

## 2016-02-11 DIAGNOSIS — R1013 Epigastric pain: Secondary | ICD-10-CM

## 2016-02-11 DIAGNOSIS — A048 Other specified bacterial intestinal infections: Secondary | ICD-10-CM

## 2016-02-11 DIAGNOSIS — B9681 Helicobacter pylori [H. pylori] as the cause of diseases classified elsewhere: Secondary | ICD-10-CM

## 2016-02-11 DIAGNOSIS — E1140 Type 2 diabetes mellitus with diabetic neuropathy, unspecified: Secondary | ICD-10-CM

## 2016-02-11 DIAGNOSIS — E1165 Type 2 diabetes mellitus with hyperglycemia: Secondary | ICD-10-CM

## 2016-02-11 LAB — POCT H PYLORI SCREEN: H Pylori Screen, POC: POSITIVE

## 2016-02-11 LAB — POCT GLYCOSYLATED HEMOGLOBIN (HGB A1C): Hemoglobin A1C: 12.1

## 2016-02-11 MED ORDER — METFORMIN HCL 500 MG PO TABS: 500.0000 mg | ORAL_TABLET | Freq: Two times a day (BID) | ORAL | Status: DC

## 2016-02-11 MED ORDER — AMOXICILLIN 500 MG PO TABS: 1000.0000 mg | ORAL_TABLET | Freq: Two times a day (BID) | ORAL | Status: AC

## 2016-02-11 MED ORDER — OMEPRAZOLE 20 MG PO TBEC
1.0000 | DELAYED_RELEASE_TABLET | Freq: Two times a day (BID) | ORAL | Status: DC
Start: 2016-02-11 — End: 2016-02-25

## 2016-02-11 MED ORDER — CLARITHROMYCIN 500 MG PO TABS: 500.0000 mg | ORAL_TABLET | Freq: Two times a day (BID) | ORAL | Status: DC

## 2016-02-11 NOTE — Patient Instructions (Addendum)
Amoxicillin: Take 2 tablets twice a day  Clarithromycin: Take 1 tablet twice a day  Omeprazole: Take 1 tablet twice a day  Can add Peptobismol (2 tablets) up to 4 times a day during meals   Metformin: take 1 tablet twice a day

## 2016-02-11 NOTE — Progress Notes (Signed)
   Subjective:    Patient ID: Justin Stein, male    DOB: [birth year], 60 y.o.   MRN: [id number]  HPI  [employer] video interpreter Trilby Leaver [phone] used   CC: stomach pain  # Stomach pain:  Epigastric area  Started about 3-4 days ago  Feels like it radiates into his right shoulder and then down all the way to his foot  Says the pain is worse with eating, and worse in the morning  Also complains of waking up at 3-4am and when he gargles water he gets a "lot" of blood, says this is more than just a tinge/streak of blood.  No blood in stool, no dark stools, has been constipated.   He says he is only taking a daily aspirin (he is not sure what dose it is), not taking an ibuprofen, aleve ROS: no dizziness, no fatigue, no chest pains, no SOB  # Diabetes  Ran out of metformin several months ago  Does not check his sugar  Has not been to an eye doctor  Social Hx: former smoker  Review of Systems   See HPI for ROS.   Past medical history, surgical, family, and social history reviewed and updated in the EMR as appropriate. Objective:  BP 134/80 mmHg  Pulse 66  Temp(Src) 97.9 F (36.6 C) (Oral)  Wt 170 lb (77.111 kg) Vitals and nursing note reviewed  General: no apparent distress  HEENT: no oropharyngeal lesions are noted, there is mild erythema or posterior pharynx. CV: normal rate, regular rhythm, no murmurs, rubs or gallop.  Resp: clear to auscultation bilaterally, normal effort Abdomen: soft, mildly tender to epigastrium without rebound or guarding, normal bowel sounds  Assessment & Plan:  1. Abdominal pain, epigastric & h pylori infection History most likely ulcerative disease. With increased reported bleeding after laying down suspect bleeding ulcer, however he is not reporting melena or blood in stool and no reports of fatigue or dizziness so this is not as likely significant requiring hospitalization. H pylori positive today in clinic. Will do triple therapy  and have him return in 2 weeks to evaluate response. Want to refer to GI for evaluation of need for endoscopy given the bleeding however patient is uninsured. Asked him to schedule an appointment with our financial counselor to see if he is eligible for orange card.  - H.pylori screen, POC - amoxicillin (AMOXIL) 500 MG tablet; Take 2 tablets (1,000 mg total) by mouth 2 (two) times daily.  Dispense: 56 tablet; Refill: 0 - clarithromycin (BIAXIN) 500 MG tablet; Take 1 tablet (500 mg total) by mouth 2 (two) times daily.  Dispense: 28 tablet; Refill: 0 - omeprazole  twice daily  2. Uncontrolled type 2 diabetes mellitus with diabetic neuropathy, without long-term current use of insulin (HCC) Uncontrolled while not taking metformin, a1c was 12.1. Discussed need to cut out almost all sugar/carbs from diet, restart metformin, needs to see eye doctor for diabetic exam. Will also have him uptitrate the metformin to  twice daily as tolerated (he had previously been on once daily ). Follow up 3 months. - POCT glycosylated hemoglobin (Hb A1C) - metFORMIN (GLUCOPHAGE) 500 MG tablet; Take 1 tablet (500 mg total) by mouth 2 (two) times daily with a meal.  Dispense: 60 tablet; Refill: 0

## 2016-02-25 ENCOUNTER — Ambulatory Visit (INDEPENDENT_AMBULATORY_CARE_PROVIDER_SITE_OTHER): Payer: Self-pay | Admitting: Family Medicine

## 2016-02-25 ENCOUNTER — Encounter: Payer: Self-pay | Admitting: Family Medicine

## 2016-02-25 VITALS — BP 128/74 | HR 61 | Temp 98.0°F | Wt 167.6 lb

## 2016-02-25 DIAGNOSIS — IMO0002 Reserved for concepts with insufficient information to code with codable children: Secondary | ICD-10-CM

## 2016-02-25 DIAGNOSIS — A048 Other specified bacterial intestinal infections: Secondary | ICD-10-CM | POA: Insufficient documentation

## 2016-02-25 DIAGNOSIS — B9681 Helicobacter pylori [H. pylori] as the cause of diseases classified elsewhere: Secondary | ICD-10-CM

## 2016-02-25 DIAGNOSIS — E1165 Type 2 diabetes mellitus with hyperglycemia: Secondary | ICD-10-CM

## 2016-02-25 DIAGNOSIS — E1140 Type 2 diabetes mellitus with diabetic neuropathy, unspecified: Secondary | ICD-10-CM

## 2016-02-25 MED ORDER — METFORMIN HCL 1000 MG PO TABS: 1000.0000 mg | ORAL_TABLET | Freq: Two times a day (BID) | ORAL | Status: DC

## 2016-02-25 MED ORDER — OMEPRAZOLE 20 MG PO TBEC: 1.0000 | DELAYED_RELEASE_TABLET | Freq: Every day | ORAL | Status: DC

## 2016-02-25 NOTE — Assessment & Plan Note (Signed)
Dietary counseling done today. Will increase metformin up to 1000mg  twice daily over next few weeks. Follow up 3 months.

## 2016-02-25 NOTE — Patient Instructions (Signed)
Finish the antibiotics (amoxicillin and clarithromycin)  Once you finish the antibiotics, continue the Omeprazole (stomach acid medicine) twice daily. When you run out of the omeprazole, you should switch this to once daily and then over time only as needed.   For constipation:  COLACE  MIRALAX  SENNA   Diabetes:  Increase the metformin to 1000mg  in the morning, 500mg  at night. In 1 week if doing well increase to 1000mg  twice a day.

## 2016-02-25 NOTE — Assessment & Plan Note (Signed)
Finishing triple therapy, few more days of antibiotics left. Significant improvement. Discussed continuation of PPI therapy and then titrate to as needed. Follow up as needed.

## 2016-02-25 NOTE — Progress Notes (Signed)
   Subjective:    Patient ID: Justin Stein, male    DOB: 03-Jul-1956, 60 y.o.   MRN: [id number]  HPI  [employer] video interpreter Justin Stein [phone] used   CC: follow up for medications  # H pylori / abdominal pain:  Reports significant improvement  Only a very small amount of pain now  No more vomiting, no hematemesis  Has a few more days left of the antibiotics  Has noticed some constipation ROS: no vomiting, no dark stools  # Diabetes  Making some dietary changes, eating more vegetables  No issues with metformin at 500mg  twice daily, ready to increase ROS: no polyuria or polydipsia  Social Hx: states will be heading back to [place] as his work visa is going to expire  Review of Systems   See HPI for ROS.   Past medical history, surgical, family, and social history reviewed and updated in the EMR as appropriate. Objective:  BP 128/74 mmHg  Pulse 61  Temp(Src) 98 F (36.7 C) (Oral)  Wt 167 lb 9.6 oz (76.023 kg) Vitals and nursing note reviewed  General: no apparent distress  Abdomen: obese, soft, there is very minimal epigastric tenderness without rebound or guarding, normal bowel sounds   Assessment & Plan:  H. pylori infection Finishing triple therapy, few more days of antibiotics left. Significant improvement. Discussed continuation of PPI therapy and then titrate to as needed. Follow up as needed.  Diabetes mellitus type II, uncontrolled (HCC) Dietary counseling done today. Will increase metformin up to 1000mg  twice daily over next few weeks. Follow up 3 months.

## 2016-03-10 ENCOUNTER — Other Ambulatory Visit: Payer: Self-pay | Admitting: Family Medicine

## 2016-03-10 DIAGNOSIS — E1140 Type 2 diabetes mellitus with diabetic neuropathy, unspecified: Secondary | ICD-10-CM

## 2016-03-10 DIAGNOSIS — IMO0002 Reserved for concepts with insufficient information to code with codable children: Secondary | ICD-10-CM

## 2016-03-10 DIAGNOSIS — E1165 Type 2 diabetes mellitus with hyperglycemia: Principal | ICD-10-CM

## 2016-03-11 MED ORDER — METFORMIN HCL 1000 MG PO TABS: 1000.0000 mg | ORAL_TABLET | Freq: Two times a day (BID) | ORAL | Status: DC

## 2017-02-23 ENCOUNTER — Emergency Department (HOSPITAL_COMMUNITY)
Admission: EM | Admit: 2017-02-23 | Discharge: 2017-02-23 | Disposition: A | Discharge status: Home / Self Care | Payer: Self-pay | Attending: Emergency Medicine | Admitting: Emergency Medicine

## 2017-02-23 ENCOUNTER — Encounter (HOSPITAL_COMMUNITY): Payer: Self-pay | Admitting: Emergency Medicine

## 2017-02-23 DIAGNOSIS — B3742 Candidal balanitis: Secondary | ICD-10-CM | POA: Insufficient documentation

## 2017-02-23 DIAGNOSIS — E1140 Type 2 diabetes mellitus with diabetic neuropathy, unspecified: Secondary | ICD-10-CM

## 2017-02-23 DIAGNOSIS — E119 Type 2 diabetes mellitus without complications: Secondary | ICD-10-CM | POA: Insufficient documentation

## 2017-02-23 DIAGNOSIS — Z87891 Personal history of nicotine dependence: Secondary | ICD-10-CM | POA: Insufficient documentation

## 2017-02-23 DIAGNOSIS — IMO0002 Reserved for concepts with insufficient information to code with codable children: Secondary | ICD-10-CM

## 2017-02-23 DIAGNOSIS — G5622 Lesion of ulnar nerve, left upper limb: Secondary | ICD-10-CM

## 2017-02-23 DIAGNOSIS — E1165 Type 2 diabetes mellitus with hyperglycemia: Secondary | ICD-10-CM

## 2017-02-23 DIAGNOSIS — G5621 Lesion of ulnar nerve, right upper limb: Secondary | ICD-10-CM | POA: Insufficient documentation

## 2017-02-23 LAB — COMPREHENSIVE METABOLIC PANEL
ALT: 45 U/L (ref 17–63)
AST: 29 U/L (ref 15–41)
Albumin: 4 g/dL (ref 3.5–5.0)
Alkaline Phosphatase: 94 U/L (ref 38–126)
Anion gap: 8 (ref 5–15)
BUN: 9 mg/dL (ref 6–20)
CHLORIDE: 100 mmol/L — AB (ref 101–111)
CO2: 25 mmol/L (ref 22–32)
CREATININE: 0.51 mg/dL — AB (ref 0.61–1.24)
Calcium: 8.8 mg/dL — ABNORMAL LOW (ref 8.9–10.3)
GFR calc Af Amer: >60 mL/min (ref >60)
Glucose, Bld: 317 mg/dL — ABNORMAL HIGH (ref 65–99)
Potassium: 3.8 mmol/L (ref 3.5–5.1)
Sodium: 133 mmol/L — ABNORMAL LOW (ref 135–145)
TOTAL PROTEIN: 6.6 g/dL (ref 6.5–8.1)
Total Bilirubin: 1 mg/dL (ref 0.3–1.2)

## 2017-02-23 LAB — CBC WITH DIFFERENTIAL/PLATELET
Basophils Absolute: 0 10*3/uL (ref 0.0–0.1)
Basophils Relative: 0 %
EOS PCT: 1 %
Eosinophils Absolute: 0.1 10*3/uL (ref 0.0–0.7)
HEMATOCRIT: 43 % (ref 39.0–52.0)
Hemoglobin: 15.3 g/dL (ref 13.0–17.0)
LYMPHS PCT: 28 %
Lymphs Abs: 1.9 10*3/uL (ref 0.7–4.0)
MCH: 31.4 pg (ref 26.0–34.0)
MCHC: 35.6 g/dL (ref 30.0–36.0)
MCV: 88.3 fL (ref 78.0–100.0)
Monocytes Absolute: 0.5 10*3/uL (ref 0.1–1.0)
Monocytes Relative: 7 %
NEUTROS ABS: 4.5 10*3/uL (ref 1.7–7.7)
Neutrophils Relative %: 64 %
PLATELETS: 176 10*3/uL (ref 150–400)
RBC: 4.87 MIL/uL (ref 4.22–5.81)
RDW: 12.7 % (ref 11.5–15.5)
WBC: 7 10*3/uL (ref 4.0–10.5)

## 2017-02-23 LAB — URINALYSIS, MICROSCOPIC (REFLEX)
BACTERIA UA: NONE SEEN
Squamous Epithelial / LPF: NONE SEEN
WBC, UA: NONE SEEN WBC/hpf (ref 0–5)

## 2017-02-23 LAB — URINALYSIS, ROUTINE W REFLEX MICROSCOPIC
Bilirubin Urine: NEGATIVE
Glucose, UA: >=500 mg/dL — AB
Hgb urine dipstick: NEGATIVE
KETONES UR: NEGATIVE mg/dL
LEUKOCYTES UA: NEGATIVE
NITRITE: NEGATIVE
PROTEIN: NEGATIVE mg/dL
Specific Gravity, Urine: 1.015 (ref 1.005–1.030)
pH: 6 (ref 5.0–8.0)

## 2017-02-23 MED ORDER — METFORMIN HCL 1000 MG PO TABS: 1000.0000 mg | ORAL_TABLET | Freq: Two times a day (BID) | ORAL | 0 refills | Status: DC

## 2017-02-23 MED ORDER — OMEPRAZOLE 20 MG PO CPDR: 20.0000 mg | DELAYED_RELEASE_CAPSULE | Freq: Two times a day (BID) | ORAL | 0 refills | Status: DC

## 2017-02-23 MED ORDER — ATORVASTATIN CALCIUM 40 MG PO TABS: 40.0000 mg | ORAL_TABLET | Freq: Every day | ORAL | 0 refills | Status: DC

## 2017-02-23 MED ORDER — CLOTRIMAZOLE 1 % EX CREA: TOPICAL_CREAM | CUTANEOUS | 0 refills | Status: DC

## 2017-02-23 NOTE — ED Triage Notes (Signed)
Pt reports burning with urination for 2 days.

## 2017-02-23 NOTE — ED Provider Notes (Signed)
[facility] Provider Note   CSN: [id number] Arrival date & time: 02/23/17  0701     History   Chief Complaint Chief Complaint  Patient presents with  . Dysuria    HPI Justin Stein is a 61 y.o. male.  HPI Patient has several complaints. Complains of numbness to the left arm starting last night. States the numbness extends from the elbow down to the hand. Denies any weakness, visual changes, speech changes or neck pain. The patient has a history of ulnar neuropathy and carpal tunnel syndrome. He also complains of white rash around the glans penis for the past 5 days. Associated with burning and itching, especially with urination. No fever or chills. Past Medical History:  Diagnosis Date  . Diabetes mellitus without complication [facility])     Patient Active Problem List   Diagnosis Date Noted  . H. pylori infection 02/25/2016  . Penile lesion 01/30/2015  . 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 Carpal tunnel syndrome of left wrist 02/24/2012  . Neuropathy (HCC) 01/09/2012  . Synovitis 01/09/2012  . Tinea pedis 01/09/2012  . Cervical pain (neck) 01/09/2012  . Diabetes mellitus type II, uncontrolled (HCC) 05/28/2009    History reviewed. No pertinent surgical history.     Home Medications    Prior to Admission medications   Medication Sig Start Date End Date Taking? Authorizing Provider  atorvastatin (LIPITOR) 40 MG tablet Take 1 tablet (40 mg total) by mouth daily. 02/23/17   Loren Racer, MD  clotrimazole (LOTRIMIN) 1 % cream Apply to affected area 2 times daily x 4 weeks 02/23/17   Loren Racer, MD  metFORMIN (GLUCOPHAGE) 1000 MG tablet Take 1 tablet (1,000 mg total) by mouth 2 (two) times daily with a meal. 02/23/17   Loren Racer, MD  omeprazole (PRILOSEC) 20 MG capsule Take 1 capsule (20 mg total) by mouth 2 (two) times daily. 02/23/17   Loren Racer, MD    Family History History reviewed. No pertinent  family history.  Social History Social History  Substance Use Topics  . Smoking status: Former Games developer  . Smokeless tobacco: Not on file  . Alcohol use No     Allergies   Patient has no known allergies.   Review of Systems Review of Systems  Constitutional: Negative for chills and fever.  Eyes: Negative for visual disturbance.  Respiratory: Negative for shortness of breath.   Cardiovascular: Negative for chest pain.  Gastrointestinal: Negative for abdominal pain, diarrhea, nausea and vomiting.  Genitourinary: Positive for dysuria and penile pain. Negative for flank pain, frequency and testicular pain.  Musculoskeletal: Negative for back pain, joint swelling, myalgias, neck pain and neck stiffness.  Skin: Positive for rash. Negative for wound.  Neurological: Positive for numbness. Negative for dizziness, weakness, light-headedness and headaches.  All other systems reviewed and are negative.    Physical Exam Updated Vital Signs BP 126/83 (BP Location: Right Arm)   Pulse 62   Temp 97.5 F (36.4 C) (Oral)   Resp 18   Ht 5' (1.524 m)   Wt 165 lb (74.8 kg)   SpO2 98%   BMI 32.22 kg/m   Physical Exam  Constitutional: He is oriented to person, place, and time. He appears well-developed and well-nourished. No distress.  HENT:  Head: Normocephalic and atraumatic.  Mouth/Throat: Oropharynx is clear and moist. No oropharyngeal exudate.  Eyes: EOM are normal. Pupils are equal, round, and reactive to light.  Neck: Normal range of motion. Neck supple.  No posterior midline cervical tenderness to palpation.  Cardiovascular: Normal rate and regular rhythm.  Exam reveals no gallop and no friction rub.   No murmur heard. Pulmonary/Chest: Effort normal and breath sounds normal. No respiratory distress. He has no wheezes. He exhibits no tenderness.  Abdominal: Soft. Bowel sounds are normal. There is no tenderness. There is no rebound and no guarding.  Genitourinary:  Genitourinary  Comments: Uncircumcised male penis. There is a white thick rash around the glans and prepuce. Mild underlying erythematous irritation. No penile discharge. No lymphadenopathy. No testicular masses or tenderness.  Musculoskeletal: Normal range of motion. He exhibits no edema or tenderness.  2+ radial pulses bilaterally  Lymphadenopathy:    He has no cervical adenopathy.  Neurological: He is alert and oriented to person, place, and time.  5/5 bilateral grip strength. 5/5 bilateral lower extremity strength. Decreased sensation to light touch over the ulnar surface of the left forearm and hand extending to the fourth and fifth digits. Ambulating without any difficulty. Cranial nerves II through XII grossly intact.  Skin: Skin is warm and dry. Capillary refill takes less than 2 seconds. No rash noted. No erythema.  Psychiatric: He has a normal mood and affect. His behavior is normal.  Nursing note and vitals reviewed.    ED Treatments / Results  Labs (all labs ordered are listed, but only abnormal results are displayed) Labs Reviewed  URINALYSIS, ROUTINE W REFLEX MICROSCOPIC - Abnormal; Notable for the following:       Result Value   Glucose, UA >=500 (*)    All other components within normal limits  COMPREHENSIVE METABOLIC PANEL - Abnormal; Notable for the following:    Sodium 133 (*)    Chloride 100 (*)    Glucose, Bld 317 (*)    Creatinine, Ser 0.51 (*)    Calcium 8.8 (*)    All other components within normal limits  URINALYSIS, MICROSCOPIC (REFLEX)  CBC WITH DIFFERENTIAL/PLATELET    EKG  EKG Interpretation None       Radiology No results found.  Procedures Procedures (including critical care time)  Medications Ordered in ED Medications - No data to display   Initial Impression / Assessment and Plan / ED Course  I have reviewed the triage vital signs and the nursing notes.  Pertinent labs & imaging results that were available during my care of the patient were  reviewed by me and considered in my medical decision making (see chart for details).    Patient with ulnar neuropathy likely due to underlying diabetes. No suspicion for central cause to the patient's numbness. No other deficits. Check basic labs including blood sugar.   Will refill medications. Advised finding primary physician to manage diabetes. We'll start on clotrimazole for candidal balanitis. Return precautions given. Final Clinical Impressions(s) / ED Diagnoses   Final diagnoses:  Candidal balanitis  Ulnar neuropathy of left upper extremity    New Prescriptions New Prescriptions   CLOTRIMAZOLE (LOTRIMIN) 1 % CREAM    Apply to affected area 2 times daily x 4 weeks     Loren Raceravid Kerrie Latour, MD 02/23/17 [phone]

## 2021-01-18 ENCOUNTER — Ambulatory Visit (HOSPITAL_COMMUNITY)
Admission: EM | Admit: 2021-01-18 | Discharge: 2021-01-18 | Disposition: A | Discharge status: Home / Self Care | Payer: Self-pay | Attending: Medical Oncology | Admitting: Medical Oncology

## 2021-01-18 ENCOUNTER — Other Ambulatory Visit: Payer: Self-pay

## 2021-01-18 ENCOUNTER — Encounter (HOSPITAL_COMMUNITY): Payer: Self-pay

## 2021-01-18 DIAGNOSIS — G5602 Carpal tunnel syndrome, left upper limb: Secondary | ICD-10-CM

## 2021-01-18 DIAGNOSIS — N481 Balanitis: Secondary | ICD-10-CM

## 2021-01-18 MED ORDER — CLOTRIMAZOLE 1 % EX CREA: TOPICAL_CREAM | CUTANEOUS | 0 refills | Status: DC

## 2021-01-18 MED ORDER — BLOOD GLUCOSE MONITOR KIT: PACK | 0 refills | Status: AC

## 2021-01-18 NOTE — Discharge Instructions (Signed)
Carpal Tunnel Night Brace for 14 days

## 2021-01-18 NOTE — ED Provider Notes (Signed)
[name]    CSN: [id number] Arrival date & time: 01/18/21  0801      History   Chief Complaint Chief Complaint  Patient presents with  . Rash  . Numbness    HPI Justin Stein is a 65 y.o. male. With Interpretor: [id number]   HPI   Left Arm pain: He reports left arm numbness when he awakes at night. He has some discomfort associated but this also resolves when he wakes up within 30 minutes. No injury to the arm. He has not tried anything to help with these symptoms. Of note he does have diabetes and has not checked his blood sugar values within the last few months. He denies changes of color of skin, swelling of hand or muscle weakness.   Rash: Pt has had a rash of his penis for the past week. He reports that this area is mildly pain with slight burning sensation. He reports no previous episodes of similar occurences. He has not tried anything for this area. No new sexual partners. No fever, discharge or dysuria.   Past Medical History:  Diagnosis Date  . Diabetes mellitus without complication [facility])     Patient Active Problem List   Diagnosis Date Noted  . H. pylori infection 02/25/2016  . Penile lesion 01/30/2015  . 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 Carpal tunnel syndrome of left wrist 02/24/2012  . Neuropathy 01/09/2012  . Synovitis 01/09/2012  . Tinea pedis 01/09/2012  . Cervical pain (neck) 01/09/2012  . Diabetes mellitus type II, uncontrolled ([place]) 05/28/2009    History reviewed. No pertinent surgical history.     Home Medications    Prior to Admission medications   Medication Sig Start Date End Date Taking? Authorizing Provider  blood glucose meter kit and supplies KIT Dispense based on patient and insurance preference. Use up to four times daily as directed. (FOR ICD-9 250.00, 250.01). 01/18/21  Yes Javante Nilsson M, PA-C  clotrimazole (LOTRIMIN) 1 % cream Apply to affected area 2 times daily  01/18/21  Yes Bexley Mclester M, PA-C  atorvastatin (LIPITOR) 40 MG tablet Take 1 tablet (40 mg total) by mouth daily. 02/23/17   Julianne Rice, MD  metFORMIN (GLUCOPHAGE) 1000 MG tablet Take 1 tablet (1,000 mg total) by mouth 2 (two) times daily with a meal. 02/23/17   Julianne Rice, MD  omeprazole (PRILOSEC) 20 MG capsule Take 1 capsule (20 mg total) by mouth 2 (two) times daily. 02/23/17   Julianne Rice, MD    Family History History reviewed. No pertinent family history.  Social History Social History   Tobacco Use  . Smoking status: Former Research scientist (life sciences)  . Smokeless tobacco: Never Used  Substance Use Topics  . Alcohol use: No  . Drug use: No     Allergies   Patient has no known allergies.   Review of Systems Review of Systems  See above in HPI  Physical Exam Triage Vital Signs ED Triage Vitals  Enc Vitals Group     BP 01/18/21 0820 134/78     Pulse Rate 01/18/21 0820 69     Resp 01/18/21 0820 18     Temp 01/18/21 0820 98.5 F (36.9 C)     Temp src --      SpO2 01/18/21 0820 95 %     Weight --      Height --      Head Circumference --      Peak Flow --  Pain Score 01/18/21 0817 7     Pain Loc --      Pain Edu? --      Excl. in [place]? --    No data found.  Updated Vital Signs BP 134/78   Pulse 69   Temp 98.5 F (36.9 C)   Resp 18   SpO2 95%   Physical Exam Vitals and nursing note reviewed. Exam conducted with a chaperone present.  Constitutional:      Appearance: He is not ill-appearing.  Musculoskeletal:        General: No swelling, deformity or signs of injury. Normal range of motion.     Right lower leg: No edema.     Left lower leg: No edema.     Comments: Positive Tinel's sign of the left.  Grip strength 5 out of 5 bilaterally  Skin:    Comments: In the right upper extremity there is no evidence of skin color changes.  Of the penile area there is very mild edema of the glans of the penis along with mild erythema and a slight white discharge.  NO lesions of concern.   Neurological:     Mental Status: He is alert.    UC Treatments / Results  Labs (all labs ordered are listed, but only abnormal results are displayed) Labs Reviewed - No data to display  Radiology No results found.  Procedures Procedures (including critical care time)  Medications Ordered in UC Medications - No data to display  Initial Impression / Assessment and Plan / UC Course  I have reviewed the triage vital signs and the nursing notes.  Pertinent labs & imaging results that were available during my care of the patient were reviewed by me and considered in my medical decision making (see chart for details).     Left carpal tunnel syndrome: I discussed with patient how this event can occur along with treatment options.  He will use a nighttime brace and will let me know if in about 2 weeks he is still having difficulties with his symptoms as he may need physical therapy or further management.  I have also recommended that he follow-up with his primary care provider as he is overdue.  Information given to patient on local primary care providers in the area.  In addition since he has not checked his glucose values in 4 months I have given him a prescription for glucometer and supplies along with red flag values that would warrant immediate medical attention.  Balanitis: I discussed how this occurs with patient and that his diabetes likely is playing a role.  This is not a sexually transmitted infection.  We discussed treatment using a cream and that it should improve over the next few days.  We discussed red flag symptoms. Final Clinical Impressions(s) / UC Diagnoses   Final diagnoses:  Balanitis  Carpal tunnel syndrome of left wrist     Discharge Instructions     Carpal Tunnel Night Brace for 14 days    ED Prescriptions    Medication Sig Dispense Auth. Provider   blood glucose meter kit and supplies KIT Dispense based on patient and insurance  preference. Use up to four times daily as directed. (FOR ICD-9 250.00, 250.01). 1 each Tahra Hitzeman M, PA-C   clotrimazole (LOTRIMIN) 1 % cream Apply to affected area 2 times daily 15 g Phelan Schadt M, [place]     PDMP not reviewed this encounter.   Hughie Closs, [place] 01/18/21 [phone]

## 2021-01-18 NOTE — ED Triage Notes (Signed)
Pt in with c/o rash on his penis that has been going on for 1 week now. Pt states it is very red.  Also c/o left arm pain and numbness in his fingers that has been going on for over 1 week. Pt states it usually happens at night.  Pt has not taken any medication for sxs  Pt is diabetic but has not checked his blood sugar in approx 4 months

## 2022-08-15 ENCOUNTER — Emergency Department (HOSPITAL_COMMUNITY): Payer: Self-pay

## 2022-08-15 ENCOUNTER — Emergency Department (HOSPITAL_COMMUNITY)
Admission: EM | Admit: 2022-08-15 | Discharge: 2022-08-15 | Disposition: A | Discharge status: Home / Self Care | Payer: Self-pay | Attending: Emergency Medicine | Admitting: Emergency Medicine

## 2022-08-15 ENCOUNTER — Encounter (HOSPITAL_COMMUNITY): Payer: Self-pay

## 2022-08-15 ENCOUNTER — Other Ambulatory Visit: Payer: Self-pay

## 2022-08-15 DIAGNOSIS — R079 Chest pain, unspecified: Secondary | ICD-10-CM

## 2022-08-15 DIAGNOSIS — E119 Type 2 diabetes mellitus without complications: Secondary | ICD-10-CM | POA: Insufficient documentation

## 2022-08-15 DIAGNOSIS — R0602 Shortness of breath: Secondary | ICD-10-CM | POA: Insufficient documentation

## 2022-08-15 DIAGNOSIS — R21 Rash and other nonspecific skin eruption: Secondary | ICD-10-CM | POA: Insufficient documentation

## 2022-08-15 DIAGNOSIS — Z7984 Long term (current) use of oral hypoglycemic drugs: Secondary | ICD-10-CM | POA: Insufficient documentation

## 2022-08-15 DIAGNOSIS — R0789 Other chest pain: Secondary | ICD-10-CM | POA: Insufficient documentation

## 2022-08-15 LAB — BASIC METABOLIC PANEL
Anion gap: 9 (ref 5–15)
BUN: 13 mg/dL (ref 8–23)
CO2: 20 mmol/L — ABNORMAL LOW (ref 22–32)
Calcium: 8.6 mg/dL — ABNORMAL LOW (ref 8.9–10.3)
Chloride: 109 mmol/L (ref 98–111)
Creatinine, Ser: 0.72 mg/dL (ref 0.61–1.24)
GFR, Estimated: >60 mL/min (ref >60)
Glucose, Bld: 187 mg/dL — ABNORMAL HIGH (ref 70–99)
Potassium: 3.8 mmol/L (ref 3.5–5.1)
Sodium: 138 mmol/L (ref 135–145)

## 2022-08-15 LAB — CBC
HCT: 41.4 % (ref 39.0–52.0)
Hemoglobin: 14.1 g/dL (ref 13.0–17.0)
MCH: 31.3 pg (ref 26.0–34.0)
MCHC: 34.1 g/dL (ref 30.0–36.0)
MCV: 91.8 fL (ref 80.0–100.0)
Platelets: 222 10*3/uL (ref 150–400)
RBC: 4.51 MIL/uL (ref 4.22–5.81)
RDW: 12.7 % (ref 11.5–15.5)
WBC: 7.4 10*3/uL (ref 4.0–10.5)
nRBC: 0 % (ref 0.0–0.2)

## 2022-08-15 LAB — D-DIMER, QUANTITATIVE: D-Dimer, Quant: 0.4 ug/mL-FEU (ref 0.00–0.50)

## 2022-08-15 LAB — TROPONIN I (HIGH SENSITIVITY): Troponin I (High Sensitivity): 3 ng/L (ref <18)

## 2022-08-15 MED ORDER — FENTANYL CITRATE PF 50 MCG/ML IJ SOSY
50.0000 ug | PREFILLED_SYRINGE | Freq: Once | INTRAMUSCULAR | Status: AC
  Administered 2022-08-15: 50 ug via INTRAVENOUS
  Filled 2022-08-15: qty 1

## 2022-08-15 MED ORDER — KETOROLAC TROMETHAMINE 15 MG/ML IJ SOLN
15.0000 mg | Freq: Once | INTRAMUSCULAR | Status: AC
  Administered 2022-08-15: 15 mg via INTRAVENOUS
  Filled 2022-08-15: qty 1

## 2022-08-15 NOTE — ED Notes (Signed)
Pt being transported to x-ray at this time.  

## 2022-08-15 NOTE — ED Notes (Signed)
Pt is back from X ray.

## 2022-08-15 NOTE — ED Provider Notes (Signed)
[facility]ERGENCY DEPARTMENT Provider Note   CSN: [id number] Arrival date & time: 08/15/22  0754     History  Chief Complaint  Patient presents with   Chest Pain    Justin Stein is a 66 y.o. male. Patient speaks Spanish but translated by family member.  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  Chest Pain Patient presents with chest pain.  Has had for the last month on and off but constant for the last week or 2.  Anterior chest.  Some radiation to left arm.  Some shortness of breath.  Unable to sleep as well.  States worse at night.  Not exertional.  Is worse with certain movements however.  No fever fevers.  No cough.  Is diabetic. Patient does have a rash over chest and upper extremities.  Appears papular.  Patient states he was not aware it was there.    Past Medical History:  Diagnosis Date   Diabetes mellitus without complication ([place])     Home Medications Prior to Admission medications   Medication Sig Start Date End Date Taking? Authorizing Provider  atorvastatin (LIPITOR) 40 MG tablet Take 1 tablet (40 mg total) by mouth daily. 02/23/17   Julianne Rice, MD  blood glucose meter kit and supplies KIT Dispense based on patient and insurance preference. Use up to four times daily as directed. (FOR ICD-9 250.00, 250.01). 01/18/21   Hughie Closs, PA-C  clotrimazole (LOTRIMIN) 1 % cream Apply to affected area 2 times daily 01/18/21   Hughie Closs, PA-C  metFORMIN (GLUCOPHAGE) 1000 MG tablet Take 1 tablet (1,000 mg total) by mouth 2 (two) times daily with a meal. 02/23/17   Julianne Rice, MD  omeprazole (PRILOSEC) 20 MG capsule Take 1 capsule (20 mg total) by mouth 2 (two) times daily. 02/23/17   Julianne Rice, MD      Allergies    Patient has no known allergies.    Review of Systems   Review of Systems  Cardiovascular:  Positive for chest pain.    Physical Exam Updated Vital Signs BP 115/72   Pulse (!) 36   Temp 97.9 F  (36.6 C) (Oral)   Resp 15   Ht _0  (1.651 m)   Wt 78.5 kg   SpO2 100%   BMI 28.79 kg/m  Physical Exam Vitals reviewed.  HENT:     Head: Normocephalic.  Cardiovascular:     Rate and Rhythm: Regular rhythm.     Heart sounds: No murmur heard. Pulmonary:     Effort: No tachypnea.  Chest:     Chest wall: Tenderness present.     Comments: Tenderness to left anterior chest wall. Abdominal:     Tenderness: There is no abdominal tenderness.  Skin:    Capillary Refill: Capillary refill takes less than 2 seconds.     Comments: Papular rash over chest and upper extremities.  Few areas do appear somewhat scraped.  Neurological:     Mental Status: He is alert and oriented to person, place, and time.     ED Results / Procedures / Treatments   Labs (all labs ordered are listed, but only abnormal results are displayed) Labs Reviewed  BASIC METABOLIC PANEL - Abnormal; Notable for the following components:      Result Value   CO2 20 (*)    Glucose, Bld 187 (*)    Calcium 8.6 (*)    All other components within normal limits  CBC  D-DIMER, QUANTITATIVE  TROPONIN I (  HIGH SENSITIVITY)    EKG EKG Interpretation  Date/Time:  Friday August 15 2022 07:59:29 EDT Ventricular Rate:  63 PR Interval:  160 QRS Duration: 94 QT Interval:  394 QTC Calculation: 403 R Axis:   -46 Text Interpretation: Normal sinus rhythm Left axis deviation Nonspecific ST abnormality Abnormal ECG When compared with ECG of 09-Jan-2012 11:38,  rate no longer bradycardia Confirmed by Davonna Belling [phone]) on 08/15/2022 8:31:12 AM  Radiology DG Chest 2 View  Result Date: 08/15/2022 CLINICAL DATA:  Left-sided chest pain radiating to left arm for approximately 2 weeks. Shortness of breath. EXAM: CHEST - 2 VIEW COMPARISON:  11/02/2013 FINDINGS: The heart size and mediastinal contours are within normal limits. Both lungs are clear. The visualized skeletal structures are unremarkable. IMPRESSION: No active  cardiopulmonary disease. Electronically Signed   By: Marlaine Hind M.D.   On: 08/15/2022 08:35    Procedures Procedures    Medications Ordered in ED Medications  ketorolac (TORADOL) 15 MG/ML injection 15 mg (has no administration in time range)  fentaNYL (SUBLIMAZE) injection 50 mcg (50 mcg Intravenous Given 08/15/22 0848)    ED Course/ Medical Decision Making/ A&P                           Medical Decision Making Amount and/or Complexity of Data Reviewed Labs: ordered. Radiology: ordered.  Risk Prescription drug management.  Patient with chest pain.  Differential diagnosis is long and includes life-threatening conditions such as coronary disease, pneumonia, pneumothorax, pulmonary embolism. Initial EKG reassuring.  No definite ST changes.  Will get chest x-ray and blood work.  Including D-dimer.  Also has rash.  Unknown cause.  Does not appear a zoster distribution.  Troponin negative.  Chest x-ray independently interpreted and shows no pneumonia or clear cause of the pain.  Lab work reassuring.  D-dimer negative.  Doubt pulmonary embolism.  Patient feels much better after treatment with a small dose of fentanyl.  Pain resolved.  We will give some Toradol however to help with prolonged pain relief.  Appears stable for discharge home.  Nonspecific chest pain but potentially chest wall pain.  Does have the rash but doubt severe cause such as endocarditis.  Will discharge home.  With outpatient follow-up as needed.         Final Clinical Impression(s) / ED Diagnoses Final diagnoses:  Nonspecific chest pain    Rx / DC Orders ED Discharge Orders     None         Davonna Belling, MD 08/15/22 [phone]

## 2022-08-15 NOTE — Discharge Instructions (Signed)
Follow-up with your doctor for the chest pain.  The work-up was reassuring today.

## 2022-08-15 NOTE — ED Triage Notes (Signed)
Pt arrived POV from home c/o left sided CP that radiates down his left arm that causes his arm to feel numb. Pt states the pain has been constant for 2 weeks. Pt also endorses SHOB as well.

## 2022-11-04 ENCOUNTER — Ambulatory Visit: Payer: Self-pay | Admitting: Emergency Medicine

## 2023-03-05 ENCOUNTER — Other Ambulatory Visit: Payer: Self-pay

## 2023-03-05 ENCOUNTER — Ambulatory Visit (INDEPENDENT_AMBULATORY_CARE_PROVIDER_SITE_OTHER): Payer: Self-pay | Admitting: Orthopaedic Surgery

## 2023-03-05 ENCOUNTER — Other Ambulatory Visit (INDEPENDENT_AMBULATORY_CARE_PROVIDER_SITE_OTHER): Payer: Self-pay

## 2023-03-05 DIAGNOSIS — M545 Low back pain, unspecified: Secondary | ICD-10-CM

## 2023-03-05 MED ORDER — METHOCARBAMOL 750 MG PO TABS
750.0000 mg | ORAL_TABLET | Freq: Two times a day (BID) | ORAL | 1 refills | Status: DC | PRN
  Filled 2023-03-05: qty 20, 10d supply, fill #0

## 2023-03-05 MED ORDER — PREDNISONE 5 MG (21) PO TBPK
ORAL_TABLET | ORAL | 0 refills | Status: DC
  Filled 2023-03-05: qty 21, 6d supply, fill #0

## 2023-03-05 NOTE — Progress Notes (Signed)
Office Visit Note   Patient: Justin Stein           Date of Birth: Oct 01, 1956           MRN: [id number] Visit Date: 03/05/2023              Requested by: No referring provider defined for this encounter. PCP: Patient, No Pcp Per   Assessment & Plan: Visit Diagnoses:  1. Low back pain, unspecified back pain laterality, unspecified chronicity, unspecified whether sciatica present     Plan: Impression is chronic low back pain.  At this point, we have discussed starting the patient on a low-dose steroid and muscle relaxer as well as sending him to formal physical therapy.  If his symptoms do not improve over the next 6 to 8 weeks or if they happen to worsen in the meantime he will let us know we will get an MRI to assess for structural abnormalities.  He will follow-up as needed otherwise.  Follow-Up Instructions: Return if symptoms worsen or fail to improve.   Orders:  Orders Placed This Encounter  Procedures   XR Lumbar Spine 2-3 Views   Ambulatory referral to Physical Therapy   Meds ordered this encounter  Medications   predniSONE (STERAPRED UNI-PAK 21 TAB) 5 MG (21) TBPK tablet    Sig: Take as directed    Dispense:  21 tablet    Refill:  0   methocarbamol (ROBAXIN-750) 750 MG tablet    Sig: Take 1 tablet (750 mg total) by mouth 2 (two) times daily as needed for muscle spasms.    Dispense:  20 tablet    Refill:  1      Procedures: No procedures performed   Clinical Data: No additional findings.   Subjective: Chief Complaint  Patient presents with   Lower Back - Pain    HPI patient is a pleasant 67 year old Spanish-speaking gentleman who is here today with an interpreter.  He is here with chronic bilateral low back pain radiating to both buttocks for the past 3 months.  He denies any pain to the groin but notes paresthesias to the anterior shins.  He has associated weakness in his legs.  He has been using topical NSAIDs without significant relief.  He  denies any bowel or bladder change or saddle paresthesias.  He has been working on a home exercise program which has minimally helped.  He has not been to formal physical therapy.  No previous ESI, facet block or surgical intervention to the lumbar spine.  Review of Systems as detailed in HPI.  All others reviewed and are negative.   Objective: Vital Signs: There were no vitals taken for this visit.  Physical Exam well-developed well-nourished gentleman in no acute distress.  Alert and oriented x 3.  Ortho Exam examination of the lumbar spine shows moderate spinous and bilateral paraspinous musculature tenderness to the lower lumbar levels.  He has no pain with lumbar flexion, extension or rotation.  No pain with straight leg raise.  No focal weakness.  He does have decreased sensation to both shins.    Specialty Comments:  No specialty comments available.  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  No past surgical history on file. Social History   Occupational History   Not on file  Tobacco Use   Smoking status: Former   Smokeless tobacco: Never  Substance and Sexual Activity   Alcohol use: No   Drug use: No   Sexual activity: Not on file

## 2023-04-07 ENCOUNTER — Encounter: Payer: Self-pay | Admitting: Orthopaedic Surgery

## 2023-04-07 ENCOUNTER — Ambulatory Visit (INDEPENDENT_AMBULATORY_CARE_PROVIDER_SITE_OTHER): Payer: Self-pay | Admitting: Orthopaedic Surgery

## 2023-04-07 ENCOUNTER — Other Ambulatory Visit: Payer: Self-pay

## 2023-04-07 DIAGNOSIS — M5442 Lumbago with sciatica, left side: Secondary | ICD-10-CM

## 2023-04-07 DIAGNOSIS — G8929 Other chronic pain: Secondary | ICD-10-CM

## 2023-04-07 MED ORDER — CYCLOBENZAPRINE HCL 5 MG PO TABS
5.0000 mg | ORAL_TABLET | Freq: Three times a day (TID) | ORAL | 3 refills | Status: DC | PRN
  Filled 2023-04-07: qty 20, 7d supply, fill #0

## 2023-04-07 MED ORDER — PREDNISONE 5 MG PO TABS
ORAL_TABLET | ORAL | 3 refills | Status: DC
  Filled 2023-04-07 (×2): qty 21, 6d supply, fill #0

## 2023-04-07 NOTE — Addendum Note (Signed)
Addended by: Wendi Maya on: 04/07/2023 11:04 AM   Modules accepted: Orders

## 2023-04-07 NOTE — Progress Notes (Signed)
Office Visit Note   Patient: Justin Stein           Date of Birth: [birth year]           MRN: [id number] Visit Date: 04/07/2023              Requested by: No referring provider defined for this encounter. PCP: Patient, No Pcp Per   Assessment & Plan: Visit Diagnoses:  1. Chronic left-sided low back pain with left-sided sciatica     Plan: Impression is chronic LBP with radiculopathy without improvement from prednisone.  Symptoms have worsened into the left leg.  Will order MRI and refill steroid dose pack. Will f/u after MRI.  Follow-Up Instructions: No follow-ups on file.   Orders:  No orders of the defined types were placed in this encounter.  Meds ordered this encounter  Medications   predniSONE (STERAPRED UNI-PAK 21 TAB) 5 MG (21) TBPK tablet    Sig: Take as directed    Dispense:  21 tablet    Refill:  3   cyclobenzaprine (FLEXERIL) 5 MG tablet    Sig: Take 1 tablet (5 mg total) by mouth 3 (three) times daily as needed for muscle spasms.    Dispense:  20 tablet    Refill:  3      Procedures: No procedures performed   Clinical Data: No additional findings.   Subjective: Chief Complaint  Patient presents with   Lower Back - Follow-up    HPI  Patient returns today for chronic low back pain and worsening symptoms in the left leg radiculopathy.  Steroids helped symptoms temporarily.  Review of Systems  Constitutional: Negative.   HENT: Negative.    Eyes: Negative.   Respiratory: Negative.    Cardiovascular: Negative.   Gastrointestinal: Negative.   Endocrine: Negative.   Genitourinary: Negative.   Skin: Negative.   Allergic/Immunologic: Negative.   Neurological: Negative.   Hematological: Negative.   Psychiatric/Behavioral: Negative.    All other systems reviewed and are negative.    Objective: Vital Signs: There were no vitals taken for this visit.  Physical Exam Vitals and nursing note reviewed.  Constitutional:      Appearance:  He is well-developed.  Pulmonary:     Effort: Pulmonary effort is normal.  Abdominal:     Palpations: Abdomen is soft.  Skin:    General: Skin is warm.  Neurological:     Mental Status: He is alert and oriented to person, place, and time.  Psychiatric:        Behavior: Behavior normal.        Thought Content: Thought content normal.        Judgment: Judgment normal.     Ortho Exam  Examination of lumbar spine shows pain with palpation.  He has weakness to manual muscle testing of the left lower extremity due to pain and guarding.  Specialty Comments:  No specialty comments available.  Imaging: No results found.   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  History reviewed. No pertinent surgical history. Social History   Occupational History   Not on file  Tobacco Use   Smoking status: Former  Smokeless tobacco: Never  Substance and Sexual Activity   Alcohol use: No   Drug use: No   Sexual activity: Not on file

## 2023-04-12 ENCOUNTER — Encounter (HOSPITAL_COMMUNITY): Payer: Self-pay

## 2023-04-12 ENCOUNTER — Emergency Department (HOSPITAL_COMMUNITY): Payer: Self-pay

## 2023-04-12 ENCOUNTER — Other Ambulatory Visit: Payer: Self-pay

## 2023-04-12 ENCOUNTER — Emergency Department (HOSPITAL_COMMUNITY)
Admission: EM | Admit: 2023-04-12 | Discharge: 2023-04-12 | Disposition: A | Discharge status: Home / Self Care | Payer: Self-pay | Attending: Emergency Medicine | Admitting: Emergency Medicine

## 2023-04-12 DIAGNOSIS — Z7984 Long term (current) use of oral hypoglycemic drugs: Secondary | ICD-10-CM | POA: Insufficient documentation

## 2023-04-12 DIAGNOSIS — R200 Anesthesia of skin: Secondary | ICD-10-CM | POA: Insufficient documentation

## 2023-04-12 DIAGNOSIS — R0789 Other chest pain: Secondary | ICD-10-CM | POA: Insufficient documentation

## 2023-04-12 DIAGNOSIS — R079 Chest pain, unspecified: Secondary | ICD-10-CM

## 2023-04-12 DIAGNOSIS — G8929 Other chronic pain: Secondary | ICD-10-CM

## 2023-04-12 DIAGNOSIS — E119 Type 2 diabetes mellitus without complications: Secondary | ICD-10-CM | POA: Insufficient documentation

## 2023-04-12 DIAGNOSIS — M545 Low back pain, unspecified: Secondary | ICD-10-CM | POA: Insufficient documentation

## 2023-04-12 LAB — LIPID PANEL
Cholesterol: 155 mg/dL (ref 0–200)
HDL: 54 mg/dL (ref >40)
LDL Cholesterol: 80 mg/dL (ref 0–99)
Total CHOL/HDL Ratio: 2.9 RATIO
Triglycerides: 105 mg/dL (ref <150)
VLDL: 21 mg/dL (ref 0–40)

## 2023-04-12 LAB — CBC
HCT: 46 % (ref 39.0–52.0)
Hemoglobin: 15.6 g/dL (ref 13.0–17.0)
MCH: 30.3 pg (ref 26.0–34.0)
MCHC: 33.9 g/dL (ref 30.0–36.0)
MCV: 89.3 fL (ref 80.0–100.0)
Platelets: 219 10*3/uL (ref 150–400)
RBC: 5.15 MIL/uL (ref 4.22–5.81)
RDW: 13.6 % (ref 11.5–15.5)
WBC: 7.8 10*3/uL (ref 4.0–10.5)
nRBC: 0 % (ref 0.0–0.2)

## 2023-04-12 LAB — BASIC METABOLIC PANEL
Anion gap: 9 (ref 5–15)
BUN: 16 mg/dL (ref 8–23)
CO2: 26 mmol/L (ref 22–32)
Calcium: 9.1 mg/dL (ref 8.9–10.3)
Chloride: 100 mmol/L (ref 98–111)
Creatinine, Ser: 0.78 mg/dL (ref 0.61–1.24)
GFR, Estimated: >60 mL/min (ref >60)
Glucose, Bld: 113 mg/dL — ABNORMAL HIGH (ref 70–99)
Potassium: 3.9 mmol/L (ref 3.5–5.1)
Sodium: 135 mmol/L (ref 135–145)

## 2023-04-12 LAB — TROPONIN I (HIGH SENSITIVITY)
Troponin I (High Sensitivity): 3 ng/L (ref <18)
Troponin I (High Sensitivity): 3 ng/L (ref <18)

## 2023-04-12 LAB — HEMOGLOBIN A1C
Hgb A1c MFr Bld: 8.6 % — ABNORMAL HIGH (ref 4.8–5.6)
Mean Plasma Glucose: 200.12 mg/dL

## 2023-04-12 MED ORDER — ACETAMINOPHEN 325 MG PO TABS
650.0000 mg | ORAL_TABLET | Freq: Once | ORAL | Status: AC
  Administered 2023-04-12: 650 mg via ORAL
  Filled 2023-04-12: qty 2

## 2023-04-12 MED ORDER — LIDOCAINE 5 % EX PTCH
1.0000 | MEDICATED_PATCH | Freq: Once | CUTANEOUS | Status: DC
Start: 2023-04-12 — End: 2023-04-12

## 2023-04-12 MED ORDER — LIDOCAINE 5 % EX PTCH
1.0000 | MEDICATED_PATCH | CUTANEOUS | Status: DC
  Administered 2023-04-12: 1 via TRANSDERMAL
  Filled 2023-04-12: qty 1

## 2023-04-12 MED ORDER — LIDOCAINE 4 % EX PTCH: 1.0000 | MEDICATED_PATCH | CUTANEOUS | 0 refills | Status: DC

## 2023-04-12 MED ORDER — KETOROLAC TROMETHAMINE 30 MG/ML IJ SOLN
30.0000 mg | Freq: Once | INTRAMUSCULAR | Status: AC
  Administered 2023-04-12: 30 mg via INTRAMUSCULAR
  Filled 2023-04-12: qty 1

## 2023-04-12 MED ORDER — ATORVASTATIN CALCIUM 40 MG PO TABS
40.0000 mg | ORAL_TABLET | Freq: Every day | ORAL | 1 refills | Status: DC
Start: 2023-04-12 — End: 2023-06-10

## 2023-04-12 MED ORDER — ASPIRIN 325 MG PO TABS
325.0000 mg | ORAL_TABLET | Freq: Once | ORAL | Status: DC
Start: 2023-04-12 — End: 2023-04-12

## 2023-04-12 MED ORDER — ASPIRIN 325 MG PO TABS
325.0000 mg | ORAL_TABLET | Freq: Every day | ORAL | Status: DC
  Administered 2023-04-12: 325 mg via ORAL
  Filled 2023-04-12: qty 1

## 2023-04-12 MED ORDER — METFORMIN HCL ER 500 MG PO TB24: 1000.0000 mg | ORAL_TABLET | Freq: Two times a day (BID) | ORAL | 1 refills | Status: DC

## 2023-04-12 MED ORDER — METFORMIN HCL ER (OSM) 500 MG PO TB24: 1000.0000 mg | ORAL_TABLET | Freq: Two times a day (BID) | ORAL | 1 refills | Status: DC

## 2023-04-12 MED ORDER — KETOROLAC TROMETHAMINE 15 MG/ML IJ SOLN: 15.0000 mg | Freq: Once | INTRAMUSCULAR | Status: DC

## 2023-04-12 MED ORDER — MELOXICAM 7.5 MG PO TABS: 7.5000 mg | ORAL_TABLET | Freq: Every day | ORAL | 0 refills | Status: DC

## 2023-04-12 NOTE — ED Triage Notes (Signed)
Pt came in via POV d/t CP that began around 0100 & rated 9/10, feels like pressure & radiates in to his Lt arm & back. Pt also reports he has had the Lt arm & bilateral leg numbness is intermittent the past month. Denies any diaphoresis, nausea or any cardiac Hx, does have DM. A/Ox4.

## 2023-04-12 NOTE — ED Provider Notes (Addendum)
[facility] DEPARTMENT AT [facility] Provider Note  CSN: [id number] Arrival date & time: 04/12/23  4098  History  Chief Complaint  Patient presents with   Chest Pain   Justin Stein is a 67 y.o. male.  67 yo man presents to the ED for acute chest pain radiating to the left arm, duration about 8 hours at this point.  He is accompanied by his granddaughter who serves as an Equities trader for [employer], they both declined video interpreter.  PMH includes type 2 diabetes, numbness and tingling of legs, and low back pain. Medications: At this time, only taking cyclobenzaprine and prednisone, orthopedic physician. No meds for T2DM.  Patient reports 1 month of intermittent chest pain that is progressively gotten worse.  No pattern, can happen at rest.  Chest pain is always left-sided and sharp, stabbing in nature.  Now when it happens, it has began to involve his left arm.  Patient also describes pain radiating up to bilateral jaw.  He was awoken overnight around 1 AM with this chest pain, he presents to the ED because this is the worst chest pain yet.  With each chest pain episode, he denies any concurrent dizziness, presyncope or syncope, SOB, epigastric pain, or diaphoresis.  He has no cardiac history that he is aware of, denies previous MI or known CAD.    Home Medications Prior to Admission medications   Medication Sig Start Date End Date Taking? Authorizing Provider  lidocaine (HM LIDOCAINE PATCH) 4 % Place 1 patch onto the skin daily. 04/12/23  Yes Fayette Pho, MD  atorvastatin (LIPITOR) 40 MG tablet Take 1 tablet (40 mg total) by mouth daily. 02/23/17   Loren Racer, MD  blood glucose meter kit and supplies KIT Dispense based on patient and insurance preference. Use up to four times daily as directed. (FOR ICD-9 250.00, 250.01). 01/18/21   Rushie Chestnut, PA-C  clotrimazole (LOTRIMIN) 1 % cream Apply to affected area 2 times daily 01/18/21   Rushie Chestnut, PA-C  cyclobenzaprine (FLEXERIL) 5 MG tablet Take 1 tablet (5 mg total) by mouth 3 (three) times daily as needed for muscle spasms. 04/07/23   Tarry Kos, MD  metFORMIN (GLUCOPHAGE) 1000 MG tablet Take 1 tablet (1,000 mg total) by mouth 2 (two) times daily with a meal. 02/23/17   Loren Racer, MD  methocarbamol (ROBAXIN-750) 750 MG tablet Take 1 tablet (750 mg total) by mouth 2 (two) times daily as needed for muscle spasms. 03/05/23   Cristie Hem, PA-C  omeprazole (PRILOSEC) 20 MG capsule Take 1 capsule (20 mg total) by mouth 2 (two) times daily. 02/23/17   Loren Racer, MD  predniSONE (DELTASONE) 5 MG tablet Day 1 take 6 tablets, day 2 take 5 tablets, day 3 take 4 tablets, day 4 take 3 tablets, day 5 take 2 tablets, day 6 take 1 tablet 04/07/23   Tarry Kos, MD     Allergies    Patient has no known allergies.    Review of Systems   Review of Systems  Constitutional:  Negative for activity change, appetite change, chills, diaphoresis, fatigue and fever.  Respiratory:  Negative for apnea, cough, choking, chest tightness, shortness of breath, wheezing and stridor.   Cardiovascular:  Positive for chest pain. Negative for palpitations and leg swelling.  Gastrointestinal:  Negative for abdominal pain, constipation, diarrhea, nausea and vomiting.   Physical Exam Updated Vital Signs BP 124/70   Pulse (!) 58   Temp (!) 97.4  F (36.3 C) (Oral)   Resp 19   Ht  (1.651 m)   Wt 78.5 kg   SpO2 100%   BMI 28.79 kg/m  Physical Exam Constitutional:      General: He is not in acute distress.    Appearance: He is well-developed and normal weight. He is not ill-appearing, toxic-appearing or diaphoretic.  HENT:     Head: Normocephalic.  Cardiovascular:     Rate and Rhythm: Normal rate and regular rhythm.     Heart sounds: Heart sounds are distant. No murmur heard. Pulmonary:     Effort: Pulmonary effort is normal. No tachypnea, accessory muscle usage or respiratory  distress.     Breath sounds: Normal breath sounds. No stridor.  Chest:     Chest wall: Tenderness (Severe TTP over left chest wall) present. No mass.  Abdominal:     Palpations: Abdomen is soft. There is no mass.     Tenderness: There is no abdominal tenderness. There is no guarding or rebound.  Musculoskeletal:     Cervical back: Normal range of motion.  Skin:    General: Skin is warm.     Capillary Refill: Capillary refill takes less than 2 seconds.  Neurological:     General: No focal deficit present.     Mental Status: He is alert and oriented to person, place, and time.     Cranial Nerves: No cranial nerve deficit.     Motor: No weakness.  Psychiatric:        Mood and Affect: Mood normal.        Behavior: Behavior normal.    ED Results / Procedures / Treatments   Labs (all labs ordered are listed, but only abnormal results are displayed) Labs Reviewed  BASIC METABOLIC PANEL - Abnormal; Notable for the following components:      Result Value   Glucose, Bld 113 (*)    All other components within normal limits  HEMOGLOBIN A1C - Abnormal; Notable for the following components:   Hgb A1c MFr Bld 8.6 (*)    All other components within normal limits  CBC  LIPID PANEL  TROPONIN I (HIGH SENSITIVITY)  TROPONIN I (HIGH SENSITIVITY)   EKG EKG Interpretation  Date/Time:  Sunday April 12 2023 10:09:37 EDT Ventricular Rate:  56 PR Interval:  159 QRS Duration: 110 QT Interval:  421 QTC Calculation: 407 R Axis:   -46 Text Interpretation: Sinus rhythm LAD, consider left anterior fascicular block Low voltage, extremity leads no stemi Confirmed by Tanda Rockers ([id number]) on 04/12/2023 10:42:45 AM  Radiology DG Chest 2 View  Result Date: 04/12/2023 CLINICAL DATA:  67 year old male with history of chest pain. EXAM: CHEST - 2 VIEW COMPARISON:  Chest x-ray 08/15/2022. FINDINGS: Lung volumes are normal. No consolidative airspace disease. No pleural effusions. No pneumothorax. No pulmonary  nodule or mass noted. Pulmonary vasculature and the cardiomediastinal silhouette are within normal limits. IMPRESSION: No radiographic evidence of acute cardiopulmonary disease. Electronically Signed   By: Trudie Reed M.D.   On: 04/12/2023 08:05    Procedures .1-3 Lead EKG Interpretation  Performed by: Fayette Pho, MD Authorized by: Sloan Leiter, DO     Interpretation: normal     ECG rate assessment: normal      Medications Ordered in ED Medications  lidocaine (LIDODERM) 5 % 1 patch (0 patches Transdermal Hold 04/12/23 1022)  ketorolac (TORADOL) 30 MG/ML injection 30 mg (has no administration in time range)  acetaminophen (TYLENOL) tablet 650 mg (650  mg Oral Given 04/12/23 0933)   ED Course/ Medical Decision Making/ A&P    Medical Decision Making 67 yo man with known T2DM, not currently under care of PCP, here for 8 hours of acute left sided chest pain with radiation to left arm. Initial troponin 3, EKG unremarkable, VSS. CBC and BMP unremarkable aside from elevated glucose to 113.  Awaiting second troponin, A1c, and lipid panel. Given troponin low risk and patient with significant TTP to left chest wall, suspect MSK etiology at this time. Will give lidocaine patch, asa, and tylenol for pain.   11 am: Troponins negative x2. Interval EKG unchanged, unremarkable. Chest pain improved with medications. Likely etiology is MSK. A1c and lipids close to goal. Plan to discharge with new prescription of home metformin and atorvastatin - patient has appointment to establish with new PCP in 06-06-23. Does disclose death of his wife one month ago, when the chest pain started. Provided local therapy resources.   The 10-year ASCVD risk score (Arnett DK, et al., 2019) is: 21%   Values used to calculate the score:     Age: 74 years     Sex: Male     Is Non-Hispanic African American: No     Diabetic: Yes     Tobacco smoker: No     Systolic Blood Pressure: 124 mmHg     Is BP treated: No     HDL  Cholesterol: 54 mg/dL     Total Cholesterol: 155 mg/dL  Amount and/or Complexity of Data Reviewed Independent Historian: friend    Details: Granddaughter at bedside.  External Data Reviewed: notes.    Details: Orthopedic notes, previous office notes, prior labs Labs: ordered. Decision-making details documented in ED Course.    Details: CMP, CBC, troponin, A1c, lipid panel Radiology: ordered. ECG/medicine tests: ordered. Decision-making details documented in ED Course.    Details: 12 lead unremarkable  Risk OTC drugs. Prescription drug management.  Final Clinical Impression(s) / ED Diagnoses Final diagnoses:  Chest wall pain   Rx / DC Orders ED Discharge Orders          Ordered    lidocaine (HM LIDOCAINE PATCH) 4 %  Every 24 hours        04/12/23 1012           Fayette Pho, MD    Fayette Pho, MD 04/12/23 1121    Fayette Pho, MD 04/12/23 1147    Tanda Rockers A, DO 04/12/23 1604

## 2023-04-12 NOTE — Discharge Instructions (Addendum)
I have translated the following text using Google translate.  As such, there are many errors.  I apologize for the poor written translation; however, we do not have written  translation services yet. He traducido el siguiente texto [employer] traductor de [employer]. Como tal, hay muchos errores. Pido disculpas por la mala traduccin escrita; sin embargo, an no contamos con servicios de traduccin escrita.  Today you were seen for chest pain. Your tests all looked normal! The EKG of your heart was normal, so you did not have a STEMI (a type of heart attack). Your heart enzyme - troponin - was normal, which means no stress on your heart. We believe this pain is from the muscles and bones of the chest wall itself, give how tender you are when we press.  Hoy lo atendieron por Journalist, newspaper. Todas tus pruebas parecan normales! El electrocardiograma de su corazn fue normal, por lo que no tuvo un STEMI (un tipo de ataque cardaco). Su enzima cardaca, la troponina, era normal, lo que significa que no hay estrs en su corazn. Creemos que este dolor proviene de los msculos y huesos de la propia pared torcica, dada la sensibilidad que sientes cuando presionamos.  Start with a lidocaine patch, apply one patch over the painful area. One patch lasts 24 hours. These are available over the counter. You can also use tylenol, aspirin, or ibuprofen for this pain. Make sure to read the labels and use the smallest amount of medicine needed to relieve pain.  Comience con un parche de lidocana y aplique un parche sobre el rea dolorida. Un parche dura 24 horas. Estos estn disponibles sin receta. Tambin puedes usar tylenol, aspirina o ibuprofeno para Therapist, sports. Asegrese de leer las etiquetas y Water quality scientist cantidad de medicamento necesaria para Engineer, materials.  You may qualify for Medicaid. Please see the flyer attached to determine if you are eligible.  Puede calificar para Medicaid. Consulte el folleto  adjunto para determinar si es elegible.  I have sent some of your home medications to your pharmacy. These are on your chart as previously prescribed to you. Please see your new primary care doctor to adjust or continue these medications.  - atorvastatin 40 mg tablet: take daily - metformin extended release 500 mg tablet: take 2 tablets twice daily He enviado algunos de sus medicamentos caseros a su farmacia. Estos estn en su expediente tal como se le recet anteriormente. Consulte a su nuevo mdico de atencin primaria para ajustar o continuar con estos medicamentos. - atorvastatina 40 mg comprimido: tomar diariamente - comprimido de metformina de liberacin prolongada de 500 mg: tomar 2 comprimidos dos veces al da  Outpatient Mental Health Providers (No Insurance required or Self Pay)  [facility]  [phone] [email or web address] Marcy Panning and [facility]  [address] [place], [place] [phone] Crisis [phone]  [facility]) can see uninsured folks for outpatient therapy [email or web address] [address] [place], [place] [address] [phone]  [facility]    Walk-in Mon-Fri, 8am-3pm [email or web address] Scull [address] [place], [place]  [phone]   [address]  [place] [phone] [facility] for psych med management, there may be a wait- if [facility] is working with clients for OPT, they will coordinate with [facility] for [employer] [facility]   Walk-in-Clinic: Monday- Friday 9:00 AM - 4:00 PM [address]   [place], [place] (  336) [phone]  [facility] [place] (Habla Espanol) walk in M-F 8am-12pm and  1pm-3pm [place]- [address]     [phone]  [employer] -[address]  Phone: [phone]  [facility] (Mental Health and substance challenges) [address]   [place] [phone]    [email or web address]    [address]  [employer] [facility]  [facility] -[address], [place]     Phone:  [phone] [facility]-  [address]  [phone]    [facility] ( virtual or zoom therapy) [email or web address]  [address] [place]  [phone]    [facility] [phone]  grief counseling, dementia and caregiver support    [facility] Walk-in MWF 12:30 to 3:00     [address] [place] [address]  [phone]  [email or web address] call to schedule an appointment     National Alliance on Mental Illness (NAMI) [place]- Wellness classes, Support groups        [address] [place], [place] [address] [phone]   [email or web address]   [facility]  (Psycho-social Rehabilitation clubhouse, Individual and group therapy) [address] [place], [place] [address]   [phone]- [phone]  24- Hour Availability:  [facility] [phone] or [phone] * [facility] [employer] (Domestic Violence, Rape, etc. )[phone] Vesta Mixer [phone] or [phone] * [facility] [phone] only) [phone] (after hours) *[facility] Mobile Crisis Unit [phone] *[place] National Suicide Hotline [phone] Len Childs)

## 2023-04-19 ENCOUNTER — Ambulatory Visit
Admission: RE | Admit: 2023-04-19 | Discharge: 2023-04-19 | Disposition: A | Discharge status: Home / Self Care | Payer: Self-pay | Source: Ambulatory Visit | Attending: Orthopaedic Surgery | Admitting: Orthopaedic Surgery

## 2023-04-19 DIAGNOSIS — G8929 Other chronic pain: Secondary | ICD-10-CM

## 2023-04-22 ENCOUNTER — Other Ambulatory Visit: Payer: Self-pay

## 2023-06-10 ENCOUNTER — Other Ambulatory Visit: Payer: Self-pay

## 2023-06-10 ENCOUNTER — Encounter: Payer: Self-pay | Admitting: Emergency Medicine

## 2023-06-10 ENCOUNTER — Ambulatory Visit (INDEPENDENT_AMBULATORY_CARE_PROVIDER_SITE_OTHER): Payer: Self-pay | Admitting: Emergency Medicine

## 2023-06-10 VITALS — BP 116/74 | HR 60 | Temp 97.6°F | Ht 65.0 in | Wt 159.5 lb

## 2023-06-10 DIAGNOSIS — E785 Hyperlipidemia, unspecified: Secondary | ICD-10-CM

## 2023-06-10 DIAGNOSIS — E1169 Type 2 diabetes mellitus with other specified complication: Secondary | ICD-10-CM

## 2023-06-10 DIAGNOSIS — Z1211 Encounter for screening for malignant neoplasm of colon: Secondary | ICD-10-CM

## 2023-06-10 DIAGNOSIS — Z7689 Persons encountering health services in other specified circumstances: Secondary | ICD-10-CM

## 2023-06-10 DIAGNOSIS — Z7984 Long term (current) use of oral hypoglycemic drugs: Secondary | ICD-10-CM

## 2023-06-10 DIAGNOSIS — M48062 Spinal stenosis, lumbar region with neurogenic claudication: Secondary | ICD-10-CM

## 2023-06-10 MED ORDER — GABAPENTIN 300 MG PO CAPS
300.0000 mg | ORAL_CAPSULE | Freq: Two times a day (BID) | ORAL | 3 refills | Status: DC
Start: 2023-06-10 — End: 2023-06-10

## 2023-06-10 MED ORDER — GABAPENTIN 300 MG PO CAPS
300.0000 mg | ORAL_CAPSULE | Freq: Two times a day (BID) | ORAL | 3 refills | Status: DC
Start: 2023-06-10 — End: 2023-07-04
  Filled 2023-06-10: qty 90, 45d supply, fill #0

## 2023-06-10 MED ORDER — GLIPIZIDE 5 MG PO TABS
5.0000 mg | ORAL_TABLET | Freq: Two times a day (BID) | ORAL | 3 refills | Status: DC
  Filled 2023-06-10: qty 180, 90d supply, fill #0
  Filled 2023-09-29: qty 180, 90d supply, fill #1

## 2023-06-10 MED ORDER — METFORMIN HCL ER 500 MG PO TB24: 1000.0000 mg | ORAL_TABLET | Freq: Two times a day (BID) | ORAL | 1 refills | Status: DC

## 2023-06-10 MED ORDER — GLIPIZIDE 5 MG PO TABS
5.0000 mg | ORAL_TABLET | Freq: Two times a day (BID) | ORAL | 3 refills | Status: DC
Start: 2023-06-10 — End: 2023-06-10

## 2023-06-10 MED ORDER — ATORVASTATIN CALCIUM 40 MG PO TABS: 40.0000 mg | ORAL_TABLET | Freq: Every day | ORAL | 1 refills | Status: DC

## 2023-06-10 NOTE — Assessment & Plan Note (Signed)
Uncontrolled diabetes with hemoglobin A1c at 8.6 Recommend to continue metformin 1000 mg twice a day and start glipizide 5 mg twice a day Hypoglycemia precautions given Cardiovascular risks associated with uncontrolled diabetes discussed Diet and nutrition discussed Recommend to continue atorvastatin 40 mg daily Follow-up in 3 months

## 2023-06-10 NOTE — Patient Instructions (Signed)
Needs to follow-up with orthopedist Dr. Roda Shutters Send needs follow-up with spinal surgeon.  Referral placed today Start glipizide 5 mg twice a day for diabetes Start gabapentin twice a day for neuropathic pain Follow-up in 3 months  Estenosis del conducto vertebral Spinal Stenosis  La estenosis del conducto vertebral ocurre cuando el conducto vertebral se hace ms pequeo. El conducto vertebral es el espacio que est entre los huesos de la columna (vrtebras). A medida que el conducto se achica, presiona los nervios que pasan por esa parte de la columna vertebral. Esto causa dolor, debilidad o prdida de la sensibilidad (adormecimiento) en los brazos o las piernas. La estenosis del conducto vertebral puede afectar el cuello, la parte superior de la espalda o la parte inferior de la espalda. Cules son las causas? Esta afeccin se produce cuando algunas partes de hueso aplican presin hacia el conducto vertebral. Este problema puede estar presente al nacer. Si ocurre despus del nacimiento, la causa puede ser: Engineer, mining de los huesos de la columna vertebral. Habitualmente comienza entre los 50 y los 60 aos de [place]. Lesin en la columna vertebral. Cirugas que le hayan realizado en la columna vertebral. Tumores en la columna vertebral. Acumulacin de calcio en la columna vertebral. Qu incrementa el riesgo? Es ms probable que desarrolle esta afeccin si: Es mayor de 50 aos de edad. Naci con un problema en la columna vertebral, como la columna encorvada (escoliosis). Tiene artritis. Esta es una enfermedad de las articulaciones. Cules son los signos o sntomas? Los sntomas frecuentes de esta afeccin incluyen los siguientes: Dolor en el cuello o la espalda. El dolor puede empeorar al ponerse de pie o caminar. Problemas con las piernas o los brazos. Una pierna o un brazo puede perder sensibilidad, tener hormigueo o estar caliente o fro. Esto puede ocurrir en un brazo o una pierna, o en  ambos. Dolor que va desde las nalgas hasta la parte inferior de la pierna (citica). Esto puede ocurrir en una o ambas piernas. [employer]. Pie cado. Esto ocurre cuando tiene problemas para Printmaker parte delantera del pie y lo arrastra en el suelo cuando camina. Los sntomas graves de esta afeccin incluyen los siguientes: Problemas para defecar (hacer una deposicin) o para Geographical information systems officer. Dificultad para Sales promotion account executive. Prdida de la sensibilidad de la pierna. Incapacidad para caminar. Los sntomas podran comenzar despacio y Theme park manager con Museum/gallery conservator. En algunos casos, no hay sntomas. Cmo se trata? Para tratar el dolor y [employer] sntomas, se le puede pedir lo siguiente: Education administrator sentarse y pararse derecho. Esta es [place]. Hacer ciertos ejercicios. Bajar de [place], si es necesario. Tomar medicamentos o aplicarse vacunas. Usar un cors o un dispositivo ortopdico. Lexicographer apoyo a la espalda. En algunos casos podra necesitar [name]. Siga estas instrucciones en su casa: Control del dolor, la rigidez y la hinchazn  Sintese y prese derecho. Si tiene un dispositivo ortopdico o un cors, selo como se lo haya indicado el mdico. Mantenga un peso saludable. Hable con su mdico si necesita ayuda para bajar de peso. Si se lo indican, aplique calor en la zona afectada. Hgalo con la frecuencia que le haya indicado el mdico. Use la fuente de calor que el mdico le recomiende, como una compresa de calor hmedo o una almohadilla trmica. Coloque una toalla entre la piel y la fuente de Airline pilot. Aplique calor durante 20 a 30 minutos. Si la piel se le pone de color rojo brillante, retire Company secretary de inmediato  para evitar quemaduras. El riesgo de quemaduras es mayor si no puede sentir el dolor, el calor o el fro. [employer] Systems ejercicios como se lo haya indicado el mdico. No haga nada que le cause dolor. Pregntele al mdico qu actividades son seguras  para usted. Es posible que Personnel officer objetos. Pregntele a su mdico cunto Database administrator. Retome sus actividades normales cuando el mdico le diga que es seguro. Instrucciones generales Use los medicamentos de venta libre y los recetados solamente como se lo haya indicado el mdico. No fume ni consuma ningn producto que contenga nicotina o tabaco. Si necesita ayuda para dejar de fumar, consulte al mdico. Siga una dieta saludable. Coma una gran cantidad de lo siguiente: Frutas. Verduras. Cereales integrales. Protena con bajo contenido de grasa [place]). Dnde buscar ms informacin [employer] of Arthritis and Musculoskeletal and Skin Diseases ([employer] de Artritis y [place] Musculoesquelticas y Arboriculturist): [email or web address] Comunquese con un mdico si: Sus sntomas no mejoran. Sus sntomas empeoran. Tiene fiebre. Solicite ayuda de inmediato si: Comienza a Psychiatrist cuello o en la parte superior de la espalda, o el dolor se hace mucho ms fuerte. Tiene mucho dolor y los medicamentos no lo [place]. Tiene un dolor de cabeza muy intenso. Tiene mareos. No ve bien. Vomita o tiene ganas de vomitar. Tiene estas cosas en la espalda o las piernas: Prdida de la sensibilidad nueva o que empeora. Hormigueo nuevo o que empeora. No puede controlar cuando defeca o cuando hace pis. El brazo o la pierna: Duele o se hincha. Se pone de color rojo. Se siente tibio. Estos sntomas pueden Customer service manager. Solicite ayuda de inmediato. Llame al 911. No espere a ver si los sntomas desaparecen. No conduzca por sus propios medios [employer] hospital. Resumen La estenosis del conducto vertebral se produce cuando el espacio que est entre los huesos de la columna se hace ms pequeo. Esta afeccin puede estar presente desde el nacimiento. La estenosis del conducto vertebral puede provocar dolor en el cuello, la espalda, las piernas o las  [place]. El tratamiento de esta afeccin se centra en disminuir el dolor y otros sntomas. Esta informacin no tiene Theme park manager el consejo del mdico. Asegrese de hacerle al mdico cualquier pregunta que tenga. Document Revised: 04/03/2022 Document Reviewed: 04/03/2022 Elsevier Patient Education  2024 [employer].

## 2023-06-10 NOTE — Assessment & Plan Note (Signed)
Recent lumbar spine MRI shows severe spinal stenosis Knee evaluation by spinal surgeon Needs to follow-up with present orthopedist Pain management discussed Recommend to start gabapentin 300 mg twice a day

## 2023-06-10 NOTE — Progress Notes (Signed)
Justin Stein 67 y.o.   Chief Complaint  Patient presents with   New Patient (Initial Visit)    Patient went to [facility] last month for back pain, numbness and tingling in his finger tips.  Cramps with both legs, Pain radiating down his back to his legs.    Joint Pain    HISTORY OF PRESENT ILLNESS: This is a 67 y.o. male first visit to this office, here to establish care with me. History of diabetes on metformin History of dyslipidemia on atorvastatin Complaining of chronic lumbar pain followed by intermittent numbness and tingling of lower extremities Recently evaluated by orthopedist. Lumbar spine MRI done on 04/19/2023 shows severe spinal stenosis of lumbar spine.  Report reviewed with patient  HPI   Prior to Admission medications   Medication Sig Start Date End Date Taking? Authorizing Provider  metFORMIN (GLUCOPHAGE-XR) 500 MG 24 hr tablet Take 2 tablets (1,000 mg total) by mouth 2 (two) times daily with a meal. 04/12/23  Yes Fayette Pho, MD  omeprazole (PRILOSEC) 20 MG capsule Take 1 capsule (20 mg total) by mouth 2 (two) times daily. 02/23/17  Yes Loren Racer, MD  atorvastatin (LIPITOR) 40 MG tablet Take 1 tablet (40 mg total) by mouth daily. Patient not taking: Reported on 06/10/2023 04/12/23 10/09/23  Fayette Pho, MD  blood glucose meter kit and supplies KIT Dispense based on patient and insurance preference. Use up to four times daily as directed. (FOR ICD-9 250.00, 250.01). Patient not taking: Reported on 06/10/2023 01/18/21   Rushie Chestnut, PA-C  clotrimazole (LOTRIMIN) 1 % cream Apply to affected area 2 times daily Patient not taking: Reported on 06/10/2023 01/18/21   Rushie Chestnut, PA-C  cyclobenzaprine (FLEXERIL) 5 MG tablet Take 1 tablet (5 mg total) by mouth 3 (three) times daily as needed for muscle spasms. Patient not taking: Reported on 06/10/2023 04/07/23   Tarry Kos, MD  lidocaine (HM LIDOCAINE PATCH) 4 % Place 1 patch onto the skin  daily. 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  Social History   Socioeconomic History   Marital status: Single    Spouse name: Not on file   Number of children: Not on file   Years of education: Not on file   Highest education level: Not on file  Occupational History   Not on file  Tobacco Use   Smoking status: Former   Smokeless tobacco: Never  Substance and Sexual Activity   Alcohol use: No   Drug use: No   Sexual activity: Not on file  Other Topics Concern   Not on file  Social History Narrative   Not on file   Social Determinants of Health   Financial Resource Strain: Not on file  Food Insecurity: Not on file  Transportation Needs: Not on file  Physical Activity: Not on file  Stress: Not on file  Social Connections: Not on file  Intimate Partner Violence: Not on file    No family history on file.   Review of Systems  Constitutional: Negative.  Negative for chills and fever.  HENT: Negative.  Negative for congestion and sore throat.   Respiratory: Negative.  Negative for cough and shortness of breath.   Cardiovascular: Negative.  Negative for chest pain and palpitations.  Gastrointestinal:  Negative for abdominal pain, diarrhea, nausea and vomiting.  Genitourinary: Negative.  Negative for dysuria and hematuria.  Musculoskeletal:  Positive for back pain.  Skin: Negative.  Negative for rash.  Neurological: Negative.  Negative for dizziness and headaches.  All other systems reviewed and are negative.   Vitals:    06/10/23 0836  BP: 116/74  Pulse: 60  Temp: 97.6 F (36.4 C)  SpO2: 96%    Physical Exam Vitals reviewed.  Constitutional:      Appearance: Normal appearance.  HENT:     Head: Normocephalic.  Eyes:     Extraocular Movements: Extraocular movements intact.  Cardiovascular:     Rate and Rhythm: Normal rate and regular rhythm.     Pulses: Normal pulses.     Heart sounds: Normal heart sounds.  Pulmonary:     Effort: Pulmonary effort is normal.     Breath sounds: Normal breath sounds.  Abdominal:     Palpations: Abdomen is soft.     Tenderness: There is no abdominal tenderness.  Skin:    General: Skin is warm and dry.     Capillary Refill: Capillary refill takes less than 2 seconds.  Neurological:     General: No focal deficit present.     Mental Status: He is alert and oriented to person, place, and time.  Psychiatric:        Mood and Affect: Mood normal.        Behavior: Behavior normal.    Lab Results  Component Value Date   HGBA1C 8.6 (H) 04/12/2023     ASSESSMENT & PLAN: A total of 48 minutes was spent with the patient and counseling/coordination of care regarding preparing for this visit, review of available medical records, review of most recent lumbar spine MRI report, review of most recent blood work results including interpretation of hemoglobin A1c, cardiovascular risk associated with uncontrolled diabetes, education on nutrition, review of all medications and changes made, pain management, prognosis, documentation, and need for follow-up.  Problem List Items Addressed This Visit       Endocrine   Dyslipidemia associated with type 2 diabetes mellitus (HCC) - Primary    Uncontrolled diabetes with hemoglobin A1c at 8.6 Recommend to continue metformin 1000 mg twice a day and start glipizide 5 mg twice a day Hypoglycemia precautions given Cardiovascular risks associated with uncontrolled diabetes discussed Diet and nutrition discussed Recommend to continue  atorvastatin 40 mg daily Follow-up in 3 months        Relevant Medications   glipiZIDE (GLUCOTROL) 5 MG tablet   metFORMIN (GLUCOPHAGE-XR) 500 MG 24 hr tablet   atorvastatin (LIPITOR) 40 MG tablet     Other   Spinal stenosis of lumbar region with neurogenic claudication    Recent lumbar spine MRI shows severe spinal stenosis Knee evaluation by spinal surgeon Needs to follow-up with present orthopedist Pain management discussed Recommend to start gabapentin 300 mg twice a day      Relevant Medications   gabapentin (NEURONTIN) 300 MG capsule   Other Relevant Orders   Ambulatory referral to Spine Surgery   Other Visit Diagnoses     Encounter to establish care       Screening for colon cancer       Relevant Orders  Cologuard      Patient Instructions  Needs to follow-up with orthopedist Dr. Roda Shutters Send needs follow-up with spinal surgeon.  Referral placed today Start glipizide 5 mg twice a day for diabetes Start gabapentin twice a day for neuropathic pain Follow-up in 3 months  Estenosis del conducto vertebral Spinal Stenosis  La estenosis del conducto vertebral ocurre cuando el conducto vertebral se hace ms pequeo. El conducto vertebral es el espacio que est entre los huesos de la columna (vrtebras). A medida que el conducto se achica, presiona los nervios que pasan por esa parte de la columna vertebral. Esto causa dolor, debilidad o prdida de la sensibilidad (adormecimiento) en los brazos o las piernas. La estenosis del conducto vertebral puede afectar el cuello, la parte superior de la espalda o la parte inferior de la espalda. Cules son las causas? Esta afeccin se produce cuando algunas partes de hueso aplican presin hacia el conducto vertebral. Este problema puede estar presente al nacer. Si ocurre despus del nacimiento, la causa puede ser: Engineer, mining de los huesos de la columna vertebral. Habitualmente comienza entre los 50 y los 60 aos de [place]. Lesin en la  columna vertebral. Cirugas que le hayan realizado en la columna vertebral. Tumores en la columna vertebral. Acumulacin de calcio en la columna vertebral. Qu incrementa el riesgo? Es ms probable que desarrolle esta afeccin si: Es mayor de 50 aos de edad. Naci con un problema en la columna vertebral, como la columna encorvada (escoliosis). Tiene artritis. Esta es una enfermedad de las articulaciones. Cules son los signos o sntomas? Los sntomas frecuentes de esta afeccin incluyen los siguientes: Dolor en el cuello o la espalda. El dolor puede empeorar al ponerse de pie o caminar. Problemas con las piernas o los brazos. Una pierna o un brazo puede perder sensibilidad, tener hormigueo o estar caliente o fro. Esto puede ocurrir en un brazo o una pierna, o en ambos. Dolor que va desde las nalgas hasta la parte inferior de la pierna (citica). Esto puede ocurrir en una o ambas piernas. [employer]. Pie cado. Esto ocurre cuando tiene problemas para Printmaker parte delantera del pie y lo arrastra en el suelo cuando camina. Los sntomas graves de esta afeccin incluyen los siguientes: Problemas para defecar (hacer una deposicin) o para Geographical information systems officer. Dificultad para Sales promotion account executive. Prdida de la sensibilidad de la pierna. Incapacidad para caminar. Los sntomas podran comenzar despacio y Theme park manager con Museum/gallery conservator. En algunos casos, no hay sntomas. Cmo se trata? Para tratar el dolor y [employer] sntomas, se le puede pedir lo siguiente: Education administrator sentarse y pararse derecho. Esta es [place]. Hacer ciertos ejercicios. Bajar de [place], si es necesario. Tomar medicamentos o aplicarse vacunas. Usar un cors o un dispositivo ortopdico. Lexicographer apoyo a la espalda. En algunos casos podra necesitar [name]. Siga estas instrucciones en su casa: Control del dolor, la rigidez y la hinchazn  Sintese y prese derecho. Si tiene un dispositivo ortopdico o un  cors, selo como se lo haya indicado el mdico. Mantenga un peso saludable. Hable con su mdico si necesita ayuda para bajar de peso. Si se lo indican, aplique calor en la zona afectada. Hgalo con la frecuencia que le haya indicado el mdico. Use la fuente de calor que el mdico le recomiende, como una compresa de calor hmedo o una almohadilla trmica. Coloque una toalla entre la piel y la fuente de Airline pilot. Aplique calor durante 20 a 30 minutos. Si la piel se le  pone de color rojo brillante, retire Company secretary de inmediato para evitar quemaduras. El riesgo de quemaduras es mayor si no puede sentir el dolor, el calor o el fro. [employer] ejercicios como se lo haya indicado el mdico. No haga nada que le cause dolor. Pregntele al mdico qu actividades son seguras para usted. Es posible que Personnel officer objetos. Pregntele a su mdico cunto Database administrator. Retome sus actividades normales cuando el mdico le diga que es seguro. Instrucciones generales Use los medicamentos de venta libre y los recetados solamente como se lo haya indicado el mdico. No fume ni consuma ningn producto que contenga nicotina o tabaco. Si necesita ayuda para dejar de fumar, consulte al mdico. Siga una dieta saludable. Coma una gran cantidad de lo siguiente: Frutas. Verduras. Cereales integrales. Protena con bajo contenido de grasa [place]). Dnde buscar ms informacin [employer] of Arthritis and Musculoskeletal and Skin Diseases ([employer] de Artritis y [place] Musculoesquelticas y Arboriculturist): www.niams.http://www.myers.net/ Comunquese con un mdico si: Sus sntomas no mejoran. Sus sntomas empeoran. Tiene fiebre. Solicite ayuda de inmediato si: Comienza a Psychiatrist cuello o en la parte superior de la espalda, o el dolor se hace mucho ms fuerte. Tiene mucho dolor y los medicamentos no lo [place]. Tiene un dolor de cabeza muy intenso. Tiene mareos. No ve  bien. Vomita o tiene ganas de vomitar. Tiene estas cosas en la espalda o las piernas: Prdida de la sensibilidad nueva o que empeora. Hormigueo nuevo o que empeora. No puede controlar cuando defeca o cuando hace pis. El brazo o la pierna: Duele o se hincha. Se pone de color rojo. Se siente tibio. Estos sntomas pueden Customer service manager. Solicite ayuda de inmediato. Llame al 911. No espere a ver si los sntomas desaparecen. No conduzca por sus propios medios [employer] [facility]. Resumen La estenosis del conducto vertebral se produce cuando el espacio que est entre los huesos de la columna se hace ms pequeo. Esta afeccin puede estar presente desde el nacimiento. La estenosis del conducto vertebral puede provocar dolor en el cuello, la espalda, las piernas o las [place]. El tratamiento de esta afeccin se centra en disminuir el dolor y otros sntomas. Esta informacin no tiene Theme park manager el consejo del mdico. Asegrese de hacerle al mdico cualquier pregunta que tenga. Document Revised: 04/03/2022 Document Reviewed: 04/03/2022 Elsevier Patient Education  2024 Elsevier Inc.    Edwina Barth, MD [facility]

## 2023-06-30 ENCOUNTER — Other Ambulatory Visit: Payer: Self-pay

## 2023-06-30 MED ORDER — METHOCARBAMOL 750 MG PO TABS
750.0000 mg | ORAL_TABLET | Freq: Four times a day (QID) | ORAL | 0 refills | Status: DC
  Filled 2023-06-30: qty 40, 10d supply, fill #0

## 2023-06-30 MED ORDER — HYDROCODONE-ACETAMINOPHEN 5-325 MG PO TABS
1.0000 | ORAL_TABLET | Freq: Four times a day (QID) | ORAL | 0 refills | Status: DC | PRN
  Filled 2023-06-30: qty 40, 10d supply, fill #0

## 2023-07-01 ENCOUNTER — Emergency Department (HOSPITAL_COMMUNITY): Payer: Self-pay

## 2023-07-01 ENCOUNTER — Emergency Department (HOSPITAL_COMMUNITY)
Admission: EM | Admit: 2023-07-01 | Discharge: 2023-07-01 | Disposition: A | Discharge status: Home / Self Care | Payer: Self-pay | Attending: Emergency Medicine | Admitting: Emergency Medicine

## 2023-07-01 ENCOUNTER — Other Ambulatory Visit: Payer: Self-pay | Admitting: Neurosurgery

## 2023-07-01 ENCOUNTER — Encounter (HOSPITAL_COMMUNITY): Payer: Self-pay

## 2023-07-01 ENCOUNTER — Other Ambulatory Visit: Payer: Self-pay

## 2023-07-01 DIAGNOSIS — M48061 Spinal stenosis, lumbar region without neurogenic claudication: Secondary | ICD-10-CM | POA: Insufficient documentation

## 2023-07-01 DIAGNOSIS — R200 Anesthesia of skin: Secondary | ICD-10-CM | POA: Insufficient documentation

## 2023-07-01 DIAGNOSIS — Z7984 Long term (current) use of oral hypoglycemic drugs: Secondary | ICD-10-CM | POA: Insufficient documentation

## 2023-07-01 DIAGNOSIS — E119 Type 2 diabetes mellitus without complications: Secondary | ICD-10-CM | POA: Insufficient documentation

## 2023-07-01 LAB — URINALYSIS, ROUTINE W REFLEX MICROSCOPIC
Bilirubin Urine: NEGATIVE
Glucose, UA: 150 mg/dL — AB
Hgb urine dipstick: NEGATIVE
Ketones, ur: NEGATIVE mg/dL
Leukocytes,Ua: NEGATIVE
Nitrite: NEGATIVE
Protein, ur: NEGATIVE mg/dL
Specific Gravity, Urine: 1.004 — ABNORMAL LOW (ref 1.005–1.030)
pH: 6 (ref 5.0–8.0)

## 2023-07-01 LAB — COMPREHENSIVE METABOLIC PANEL
ALT: 23 U/L (ref 0–44)
AST: 22 U/L (ref 15–41)
Albumin: 3.5 g/dL (ref 3.5–5.0)
Alkaline Phosphatase: 74 U/L (ref 38–126)
Anion gap: 9 (ref 5–15)
BUN: 15 mg/dL (ref 8–23)
CO2: 22 mmol/L (ref 22–32)
Calcium: 8.7 mg/dL — ABNORMAL LOW (ref 8.9–10.3)
Chloride: 105 mmol/L (ref 98–111)
Creatinine, Ser: 0.74 mg/dL (ref 0.61–1.24)
GFR, Estimated: >60 mL/min (ref >60)
Glucose, Bld: 184 mg/dL — ABNORMAL HIGH (ref 70–99)
Potassium: 4 mmol/L (ref 3.5–5.1)
Sodium: 136 mmol/L (ref 135–145)
Total Bilirubin: 0.5 mg/dL (ref 0.3–1.2)
Total Protein: 6.2 g/dL — ABNORMAL LOW (ref 6.5–8.1)

## 2023-07-01 LAB — CBC WITH DIFFERENTIAL/PLATELET
Abs Immature Granulocytes: 0.02 10*3/uL (ref 0.00–0.07)
Basophils Absolute: 0 10*3/uL (ref 0.0–0.1)
Basophils Relative: 0 %
Eosinophils Absolute: 0.2 10*3/uL (ref 0.0–0.5)
Eosinophils Relative: 3 %
HCT: 40.1 % (ref 39.0–52.0)
Hemoglobin: 13.6 g/dL (ref 13.0–17.0)
Immature Granulocytes: 0 %
Lymphocytes Relative: 37 %
Lymphs Abs: 2.2 10*3/uL (ref 0.7–4.0)
MCH: 31.3 pg (ref 26.0–34.0)
MCHC: 33.9 g/dL (ref 30.0–36.0)
MCV: 92.2 fL (ref 80.0–100.0)
Monocytes Absolute: 0.4 10*3/uL (ref 0.1–1.0)
Monocytes Relative: 6 %
Neutro Abs: 3.3 10*3/uL (ref 1.7–7.7)
Neutrophils Relative %: 54 %
Platelets: 205 10*3/uL (ref 150–400)
RBC: 4.35 MIL/uL (ref 4.22–5.81)
RDW: 13 % (ref 11.5–15.5)
WBC: 6.1 10*3/uL (ref 4.0–10.5)
nRBC: 0 % (ref 0.0–0.2)

## 2023-07-01 MED ORDER — MORPHINE SULFATE (PF) 4 MG/ML IV SOLN
4.0000 mg | Freq: Once | INTRAVENOUS | Status: AC
  Administered 2023-07-01: 4 mg via INTRAVENOUS
  Filled 2023-07-01: qty 1

## 2023-07-01 MED ORDER — DEXAMETHASONE SODIUM PHOSPHATE 10 MG/ML IJ SOLN
10.0000 mg | Freq: Once | INTRAMUSCULAR | Status: AC
  Administered 2023-07-01: 10 mg via INTRAVENOUS
  Filled 2023-07-01: qty 1

## 2023-07-01 MED ORDER — DIAZEPAM 5 MG/ML IJ SOLN: 2.5000 mg | Freq: Once | INTRAMUSCULAR | Status: DC | PRN

## 2023-07-01 MED ORDER — METHYLPREDNISOLONE 4 MG PO TBPK: ORAL_TABLET | Freq: Every day | ORAL | 0 refills | Status: DC

## 2023-07-01 MED ORDER — ONDANSETRON HCL 4 MG/2ML IJ SOLN
4.0000 mg | Freq: Once | INTRAMUSCULAR | Status: AC
  Administered 2023-07-01: 4 mg via INTRAVENOUS
  Filled 2023-07-01: qty 2

## 2023-07-01 MED ORDER — OXYCODONE-ACETAMINOPHEN 5-325 MG PO TABS: 1.0000 | ORAL_TABLET | Freq: Four times a day (QID) | ORAL | 0 refills | Status: DC | PRN

## 2023-07-01 MED ORDER — SODIUM CHLORIDE 0.9 % IV BOLUS
500.0000 mL | Freq: Once | INTRAVENOUS | Status: AC
  Administered 2023-07-01: 500 mL via INTRAVENOUS

## 2023-07-01 NOTE — ED Provider Notes (Signed)
[place] EMERGENCY DEPARTMENT AT [facility] Provider Note   CSN: [id number] Arrival date & time: 07/01/23  8295     History  Chief Complaint  Patient presents with   Back Pain    Justin Stein is a 67 y.o. male.  Pt is a 67 yo male with pmhx significant for dm, hld, and chronic lbp.  Pt did see Dr. Roda Shutters for his back pain and had a MRI in April which showed severe spinal stenosis.  He has been having problems with pain and intermittent numbness of his legs, especially the right leg.  He has fallen, but has not hurt himself.  He did see Dr. Wynetta Emery who told him that he needed surgery.  Pt denies cp or abd pain.  Pt speaks Spanish, but daughter is interpreting.  Both refuse video interpreter.  Pt has been on Robaxin and Lortab which is not helping.       Home Medications Prior to Admission medications   Medication Sig Start Date End Date Taking? Authorizing Provider  methylPREDNISolone (MEDROL DOSEPAK) 4 MG TBPK tablet Take by mouth daily. Day 1:  2 pills at breakfast, 1 pill at lunch, 1 pill after supper, 2 pills at bedtime;Day 2:  1 pill at breakfast, 1 pill at lunch, 1 pill after supper, 2 pills at bedtime;Day 3:  1 pill at breakfast, 1 pill at lunch, 1 pill after supper, 1 pill at bedtime;Day 4:  1 pill at breakfast, 1 pill at lunch, 1 pill at bedtime;Day 5:  1 pill at breakfast, 1 pill at bedtime;Day 6:  1 pill at breakfast 07/01/23  Yes Jacalyn Lefevre, MD  oxyCODONE-acetaminophen (PERCOCET/ROXICET) 5-325 MG tablet Take 1 tablet by mouth every 6 (six) hours as needed for severe pain. 07/01/23  Yes Jacalyn Lefevre, MD  atorvastatin (LIPITOR) 40 MG tablet Take 1 tablet (40 mg total) by mouth daily. 06/10/23 12/07/23  Georgina Quint, MD  blood glucose meter kit and supplies KIT Dispense based on patient and insurance preference. Use up to four times daily as directed. (FOR ICD-9 250.00, 250.01). Patient not taking: Reported on 06/10/2023 01/18/21   Rushie Chestnut,  PA-C  clotrimazole (LOTRIMIN) 1 % cream Apply to affected area 2 times daily Patient not taking: Reported on 06/10/2023 01/18/21   Rushie Chestnut, PA-C  cyclobenzaprine (FLEXERIL) 5 MG tablet Take 1 tablet (5 mg total) by mouth 3 (three) times daily as needed for muscle spasms. Patient not taking: Reported on 06/10/2023 04/07/23   Tarry Kos, MD  gabapentin (NEURONTIN) 300 MG capsule Take 1 capsule (300 mg total) by mouth 2 (two) times daily. 06/10/23   Georgina Quint, MD  glipiZIDE (GLUCOTROL) 5 MG tablet Take 1 tablet (5 mg total) by mouth 2 (two) times daily before a meal. 06/10/23 06/04/24  Sagardia, Eilleen Kempf, MD  HYDROcodone-acetaminophen (NORCO/VICODIN) 5-325 MG tablet Take 1 tablet by mouth every 6 (six) hours as needed for pain 06/30/23     lidocaine (HM LIDOCAINE PATCH) 4 % Place 1 patch onto the skin daily. Patient not taking: Reported on 06/10/2023 04/12/23   Fayette Pho, MD  metFORMIN (GLUCOPHAGE-XR) 500 MG 24 hr tablet Take 2 tablets (1,000 mg total) by mouth 2 (two) times daily with a meal. 06/10/23   Sagardia, Eilleen Kempf, MD  methocarbamol (ROBAXIN) 750 MG tablet Take 1 tablet (750 mg total) by mouth every 6 (six) hours. 06/30/23     omeprazole (PRILOSEC) 20 MG capsule Take 1 capsule (20 mg total) by  mouth 2 (two) times daily. 02/23/17   Loren Racer, MD      Allergies    Patient has no known allergies.    Review of Systems   Review of Systems  Musculoskeletal:  Positive for back pain.  Neurological:  Positive for numbness.  All other systems reviewed and are negative.   Physical Exam Updated Vital Signs BP (!) 141/79   Pulse 62   Temp 97.8 F (36.6 C) (Oral)   Resp 18   SpO2 96%  Physical Exam Vitals and nursing note reviewed.  Constitutional:      Appearance: Normal appearance.  HENT:     Head: Normocephalic and atraumatic.     Right Ear: External ear normal.     Left Ear: External ear normal.     Nose: Nose normal.     Mouth/Throat:     Mouth:  Mucous membranes are moist.     Pharynx: Oropharynx is clear.  Eyes:     Extraocular Movements: Extraocular movements intact.     Conjunctiva/sclera: Conjunctivae normal.     Pupils: Pupils are equal, round, and reactive to light.  Cardiovascular:     Rate and Rhythm: Normal rate and regular rhythm.     Pulses: Normal pulses.     Heart sounds: Normal heart sounds.  Pulmonary:     Effort: Pulmonary effort is normal.     Breath sounds: Normal breath sounds.  Abdominal:     General: Abdomen is flat. Bowel sounds are normal.     Palpations: Abdomen is soft.  Musculoskeletal:        General: Normal range of motion.     Cervical back: Normal range of motion and neck supple.  Skin:    General: Skin is warm.     Capillary Refill: Capillary refill takes less than 2 seconds.  Neurological:     Mental Status: He is alert and oriented to person, place, and time.     Comments: Slight weakness + numbness in RLE  Psychiatric:        Mood and Affect: Mood normal.        Behavior: Behavior normal.     ED Results / Procedures / Treatments   Labs (all labs ordered are listed, but only abnormal results are displayed) Labs Reviewed  COMPREHENSIVE METABOLIC PANEL - Abnormal; Notable for the following components:      Result Value   Glucose, Bld 184 (*)    Calcium 8.7 (*)    Total Protein 6.2 (*)    All other components within normal limits  URINALYSIS, ROUTINE W REFLEX MICROSCOPIC - Abnormal; Notable for the following components:   Color, Urine COLORLESS (*)    Specific Gravity, Urine 1.004 (*)    Glucose, UA 150 (*)    All other components within normal limits  CBC WITH DIFFERENTIAL/PLATELET    EKG None  Radiology MR LUMBAR SPINE WO CONTRAST  Result Date: 07/01/2023 CLINICAL DATA:  Low back pain, cauda equina syndrome suspected. EXAM: MRI LUMBAR SPINE WITHOUT CONTRAST TECHNIQUE: Multiplanar, multisequence MR imaging of the lumbar spine was performed. No intravenous contrast was  administered. COMPARISON:  MRI lumbar spine 04/19/2023. FINDINGS: Segmentation: Conventional numbering is assumed with 5 non-rib-bearing, lumbar type vertebral bodies. Alignment:  Physiologic. Vertebrae: Modic type 1 degenerative endplate marrow signal changes at L3-4. Conus medullaris and cauda equina: Conus extends to the L1 level. Conus and cauda equina appear normal. Paraspinal and other soft tissues: Marked distention of the bladder with associated mild hydroureteronephrosis.  Disc levels: T12-L1:  Normal. L1-L2:  Normal. L2-L3: Left eccentric disc bulge results in displacement of the traversing left L3 nerve root in the subarticular zone and displacement of the exited left L2 nerve root in the extraforaminal zone, unchanged. L3-L4: Disc bulge with superimposed left central disc protrusion and bilateral facet arthropathy results in severe spinal canal stenosis, unchanged. No significant neural foraminal narrowing. L4-L5: Right eccentric disc bulge results in displacement of the traversing right L5 nerve root in the subarticular zone, unchanged. Right-greater-than-left facet arthropathy contributes to moderate right neural foraminal narrowing, unchanged. L5-S1: Right eccentric disc bulge and right-greater-than-left facet arthropathy contribute to severe right neural foraminal narrowing, unchanged. No spinal canal stenosis. IMPRESSION: 1. Unchanged multilevel lumbar spondylosis, worst at L3-4, where there is severe spinal canal stenosis. 2. Left eccentric disc bulge at L2-3 results in displacement of both the traversing left L3 nerve root and the exited left L2 nerve root. 3. Right eccentric disc bulge at L4-5 results in displacement of the traversing right L5 nerve root in the subarticular zone. 4. Marked distention of the bladder with associated mild hydroureteronephrosis. Electronically Signed   By: Orvan Falconer M.D.   On: 07/01/2023 13:18    Procedures Procedures    Medications Ordered in  ED Medications  diazepam (VALIUM) injection 2.5 mg (has no administration in time range)  dexamethasone (DECADRON) injection 10 mg (has no administration in time range)  sodium chloride 0.9 % bolus 500 mL (0 mLs Intravenous Stopped 07/01/23 1036)  morphine (PF) 4 MG/ML injection 4 mg (4 mg Intravenous Given 07/01/23 0935)  ondansetron (ZOFRAN) injection 4 mg (4 mg Intravenous Given 07/01/23 0933)  dexamethasone (DECADRON) injection 10 mg (10 mg Intravenous Given 07/01/23 1610)    ED Course/ Medical Decision Making/ A&P                             Medical Decision Making Amount and/or Complexity of Data Reviewed Labs: ordered. Radiology: ordered.  Risk Prescription drug management.   This patient presents to the ED for concern of back pain and right leg numbness, this involves an extensive number of treatment options, and is a complaint that carries with it a high risk of complications and morbidity.  The differential diagnosis includes spinal stenosis, radiculopathy   Co morbidities that complicate the patient evaluation  dm, hld, and chronic lbp   Additional history obtained:  Additional history obtained from epic chart review External records from outside source obtained and reviewed including daughter   Lab Tests:  I Ordered, and personally interpreted labs.  The pertinent results include:  cbc nl, cmp with glucose elevated at 184, ua nl other than some glucosuria   Imaging Studies ordered:  I ordered imaging studies including MRI  I independently visualized and interpreted imaging which showed  Unchanged multilevel lumbar spondylosis, worst at L3-4, where  there is severe spinal canal stenosis.  2. Left eccentric disc bulge at L2-3 results in displacement of both  the traversing left L3 nerve root and the exited left L2 nerve root.  3. Right eccentric disc bulge at L4-5 results in displacement of the  traversing right L5 nerve root in the subarticular zone.  4.  Marked distention of the bladder with associated mild  hydroureteronephrosis.   I agree with the radiologist interpretation   Cardiac Monitoring:  The patient was maintained on a cardiac monitor.  I personally viewed and interpreted the cardiac monitored which showed an underlying  rhythm of: nsr   Medicines ordered and prescription drug management:  I ordered medication including decadron, morphine  for sx  Reevaluation of the patient after these medicines showed that the patient improved I have reviewed the patients home medicines and have made adjustments as needed   Test Considered:  mri   Critical Interventions:  Pain control   Consultations Obtained:  I requested consultation with the NS (Dr. Wynetta Emery),  and discussed lab and imaging findings as well as pertinent plan - they recommend: MRI   Problem List / ED Course:  Severe lumbar stenosis:  PVR 141.  MRI does not look worse.  Dr. Wynetta Emery recommends an additional 10 mg iv decadron dose + d/c with medrol dose pack and percocet.  He will try to get pt on the schedule on Friday the 12th.  Pt is to return if worse.   Reevaluation:  After the interventions noted above, I reevaluated the patient and found that they have :improved   Social Determinants of Health:  Speaks Spanish   Dispostion:  After consideration of the diagnostic results and the patients response to treatment, I feel that the patent would benefit from discharge with outpatient f/u.          Final Clinical Impression(s) / ED Diagnoses Final diagnoses:  Spinal stenosis of lumbar region without neurogenic claudication    Rx / DC Orders ED Discharge Orders          Ordered    methylPREDNISolone (MEDROL DOSEPAK) 4 MG TBPK tablet  Daily        07/01/23 1522    oxyCODONE-acetaminophen (PERCOCET/ROXICET) 5-325 MG tablet  Every 6 hours PRN        07/01/23 1522              Jacalyn Lefevre, MD 07/01/23 1526

## 2023-07-01 NOTE — ED Triage Notes (Signed)
Pt came in via POV d/t lower back pain since last night while he was in the shower it was hard to move his feet. Decreased sensation in his feet & legs, also explaining that his feet feel heavy. Rates his pain 10/10, A/Ox4, denies any recent falls/injury. Hx of spinal stenosis.

## 2023-07-01 NOTE — Consult Note (Signed)
Reason for Consult: Back pain numbness in his legs Referring Physician: Emergency department  Justin Stein is an 67 y.o. male.  HPI: 67 year old who I saw in clinic yesterday with back pain and lumbar radiculopathy workup revealed severe spinal stenosis at L3-4 with disc herniation at that level as well as moderate stenosis at L2-3.  Patient's exam was neurologically intact with strength had some numbness but was stable we planned surgical decompression as an outpatient.  However this morning patient woke up and worsening pain and said increased numbness in his legs.  Patient was evaluated was given IV steroids IV pain medication and a repeat MRI scan patient does feel significantly better at the time of this evaluation.  His pain is much better controlled the numbness is gone back to his baseline he denies any saddle anesthesia he has been urinating fine the emergency department did check a PVR which was 140.  So after extensive discussion with the patient and family we feel that the patient is back to his neurologic baseline he does not have a cauda equina syndrome so we will discharge on oral steroids and stronger pain medication and I will try to move up the surgical schedule and try to get him on the surgical schedule on Friday or Monday  Past Medical History:  Diagnosis Date   Diabetes mellitus without complication (HCC)     History reviewed. No pertinent surgical history.  History reviewed. No pertinent family history.  Social History:  reports that he has quit smoking. He has never used smokeless tobacco. He reports that he does not drink alcohol and does not use drugs.  Allergies: No Known Allergies  Medications: I have reviewed the patient's current medications.  Results for orders placed or performed during the hospital encounter of 07/01/23 (from the past 48 hour(s))  Comprehensive metabolic panel     Status: Abnormal   Collection Time: 07/01/23  9:21 AM  Result Value Ref  Range   Sodium 136 135 - 145 mmol/L   Potassium 4.0 3.5 - 5.1 mmol/L   Chloride 105 98 - 111 mmol/L   CO2 22 22 - 32 mmol/L   Glucose, Bld 184 (H) 70 - 99 mg/dL    Comment: Glucose reference range applies only to samples taken after fasting for at least 8 hours.   BUN 15 8 - 23 mg/dL   Creatinine, Ser 2.95 0.61 - 1.24 mg/dL   Calcium 8.7 (L) 8.9 - 10.3 mg/dL   Total Protein 6.2 (L) 6.5 - 8.1 g/dL   Albumin 3.5 3.5 - 5.0 g/dL   AST 22 15 - 41 U/L   ALT 23 0 - 44 U/L   Alkaline Phosphatase 74 38 - 126 U/L   Total Bilirubin 0.5 0.3 - 1.2 mg/dL   GFR, Estimated >28 >41 mL/min    Comment: (NOTE) Calculated using the CKD-EPI Creatinine Equation (2021)    Anion gap 9 5 - 15    Comment: Performed at [facility], [address], [place], [place] [address]  CBC with Differential     Status: None   Collection Time: 07/01/23  9:21 AM  Result Value Ref Range   WBC 6.1 4.0 - 10.5 K/uL   RBC 4.35 4.22 - 5.81 MIL/uL   Hemoglobin 13.6 13.0 - 17.0 g/dL   HCT 10.2 72.5 - 36.6 %   MCV 92.2 80.0 - 100.0 fL   MCH 31.3 26.0 - 34.0 pg   MCHC 33.9 30.0 - 36.0 g/dL  RDW 13.0 11.5 - 15.5 %   Platelets 205 150 - 400 K/uL   nRBC 0.0 0.0 - 0.2 %   Neutrophils Relative % 54 %   Neutro Abs 3.3 1.7 - 7.7 K/uL   Lymphocytes Relative 37 %   Lymphs Abs 2.2 0.7 - 4.0 K/uL   Monocytes Relative 6 %   Monocytes Absolute 0.4 0.1 - 1.0 K/uL   Eosinophils Relative 3 %   Eosinophils Absolute 0.2 0.0 - 0.5 K/uL   Basophils Relative 0 %   Basophils Absolute 0.0 0.0 - 0.1 K/uL   Immature Granulocytes 0 %   Abs Immature Granulocytes 0.02 0.00 - 0.07 K/uL    Comment: Performed at [facility], [address] [place], [place] [address]  Urinalysis, Routine w reflex microscopic -Urine, Clean Catch     Status: Abnormal   Collection Time: 07/01/23 10:00 AM  Result Value Ref Range   Color, Urine COLORLESS (A) YELLOW   APPearance CLEAR CLEAR   Specific Gravity, Urine 1.004 (L) 1.005 - 1.030   pH 6.0  5.0 - 8.0   Glucose, UA 150 (A) NEGATIVE mg/dL   Hgb urine dipstick NEGATIVE NEGATIVE   Bilirubin Urine NEGATIVE NEGATIVE   Ketones, ur NEGATIVE NEGATIVE mg/dL   Protein, ur NEGATIVE NEGATIVE mg/dL   Nitrite NEGATIVE NEGATIVE   Leukocytes,Ua NEGATIVE NEGATIVE    Comment: Performed at [facility], [address], [place], [place] [address]    MR LUMBAR SPINE WO CONTRAST  Result Date: 07/01/2023 CLINICAL DATA:  Low back pain, cauda equina syndrome suspected. EXAM: MRI LUMBAR SPINE WITHOUT CONTRAST TECHNIQUE: Multiplanar, multisequence MR imaging of the lumbar spine was performed. No intravenous contrast was administered. COMPARISON:  MRI lumbar spine 04/19/2023. FINDINGS: Segmentation: Conventional numbering is assumed with 5 non-rib-bearing, lumbar type vertebral bodies. Alignment:  Physiologic. Vertebrae: Modic type 1 degenerative endplate marrow signal changes at L3-4. Conus medullaris and cauda equina: Conus extends to the L1 level. Conus and cauda equina appear normal. Paraspinal and other soft tissues: Marked distention of the bladder with associated mild hydroureteronephrosis. Disc levels: T12-L1:  Normal. L1-L2:  Normal. L2-L3: Left eccentric disc bulge results in displacement of the traversing left L3 nerve root in the subarticular zone and displacement of the exited left L2 nerve root in the extraforaminal zone, unchanged. L3-L4: Disc bulge with superimposed left central disc protrusion and bilateral facet arthropathy results in severe spinal canal stenosis, unchanged. No significant neural foraminal narrowing. L4-L5: Right eccentric disc bulge results in displacement of the traversing right L5 nerve root in the subarticular zone, unchanged. Right-greater-than-left facet arthropathy contributes to moderate right neural foraminal narrowing, unchanged. L5-S1: Right eccentric disc bulge and right-greater-than-left facet arthropathy contribute to severe right neural foraminal narrowing,  unchanged. No spinal canal stenosis. IMPRESSION: 1. Unchanged multilevel lumbar spondylosis, worst at L3-4, where there is severe spinal canal stenosis. 2. Left eccentric disc bulge at L2-3 results in displacement of both the traversing left L3 nerve root and the exited left L2 nerve root. 3. Right eccentric disc bulge at L4-5 results in displacement of the traversing right L5 nerve root in the subarticular zone. 4. Marked distention of the bladder with associated mild hydroureteronephrosis. Electronically Signed   By: Orvan Falconer M.D.   On: 07/01/2023 13:18    Review of Systems  Neurological:  Positive for weakness and numbness.   Blood pressure (!) 141/79, pulse 62, temperature 97.8 F (36.6 C), temperature source Oral, resp. rate 18, SpO2 96 %. Physical  Exam  Assessment/Plan: 67 year old gentleman with back and leg pain moderately severe spinal stenosis with a disc herniation better on steroids back to his baseline does not have a cauda equina syndrome is urinating and ambulating.  So we will discharge him on steroids and stronger pain medication and get him on the surgical schedule.  Mariam Dollar 07/01/2023, 3:23 PM

## 2023-07-02 ENCOUNTER — Encounter (HOSPITAL_COMMUNITY): Payer: Self-pay | Admitting: Neurosurgery

## 2023-07-02 ENCOUNTER — Other Ambulatory Visit: Payer: Self-pay

## 2023-07-02 ENCOUNTER — Telehealth: Payer: Self-pay

## 2023-07-02 NOTE — Transitions of Care (Post Inpatient/ED Visit) (Signed)
07/02/2023  Name: Justin Stein MRN: [id number] DOB: 1956-09-02  Today's TOC FU Call Status: Today's TOC FU Call Status:: Successful TOC FU Call Competed TOC FU Call Complete Date: 07/02/23  Transition Care Management Follow-up Telephone Call Date of Discharge: 07/01/23 Discharge Facility: [facility]) Type of Discharge: Emergency Department Reason for ED Visit: Other: (spinal stenosis) How have you been since you were released from the hospital?: Better Any questions or concerns?: No  Items Reviewed: Did you receive and understand the discharge instructions provided?: Yes Medications obtained,verified, and reconciled?: Yes (Medications Reviewed) Any new allergies since your discharge?: No Dietary orders reviewed?: Yes Do you have support at home?: Yes People in Home: child(ren), adult  Medications Reviewed Today: Medications Reviewed Today     Reviewed by Karena Addison, LPN (Licensed Practical Nurse) on 07/02/23 at 1116  Med List Status: <None>   Medication Order Taking? Sig Documenting Provider Last Dose Status Informant  atorvastatin (LIPITOR) 40 MG tablet [id number] No Take 1 tablet (40 mg total) by mouth daily.  Patient not taking: Reported on 07/02/2023   Georgina Quint, MD Not Taking Active Family Member  blood glucose meter kit and supplies KIT [id number] No Dispense based on patient and insurance preference. Use up to four times daily as directed. (FOR ICD-9 250.00, 250.01).  Patient not taking: Reported on 06/10/2023   Karen Kitchens Not Taking Active Family Member  Camphor-Eucalyptus-Menthol Nickie Retort [place]) [id number]  Apply 1 Application topically daily as needed (pain). [provider]  Active Family Member  cyclobenzaprine (FLEXERIL) 5 MG tablet [id number] No Take 1 tablet (5 mg total) by mouth 3 (three) times daily as needed for muscle spasms.  Patient not taking: Reported on 06/10/2023   Tarry Kos, MD Not Taking Active  Family Member  gabapentin (NEURONTIN) 300 MG capsule [id number] No Take 1 capsule (300 mg total) by mouth 2 (two) times daily.  Patient not taking: Reported on 07/02/2023   Georgina Quint, MD Not Taking Active Family Member  glipiZIDE (GLUCOTROL) 5 MG tablet [id number]  Take 1 tablet (5 mg total) by mouth 2 (two) times daily before a meal. Sagardia, Eilleen Kempf, MD  Active Family Member  HYDROcodone-acetaminophen (NORCO/VICODIN) 5-325 MG tablet [id number] No Take 1 tablet by mouth every 6 (six) hours as needed for pain  Patient not taking: Reported on 07/02/2023    Not Taking Active Family Member  metFORMIN (GLUCOPHAGE-XR) 500 MG 24 hr tablet [id number]  Take 2 tablets (1,000 mg total) by mouth 2 (two) times daily with a meal. Sagardia, Eilleen Kempf, MD  Active Family Member  methocarbamol (ROBAXIN) 750 MG tablet [id number] No Take 1 tablet (750 mg total) by mouth every 6 (six) hours.  Patient not taking: Reported on 07/02/2023    Not Taking Active Family Member  methylPREDNISolone (MEDROL DOSEPAK) 4 MG TBPK tablet [id number]  Take by mouth daily. Day 1:  2 pills at breakfast, 1 pill at lunch, 1 pill after supper, 2 pills at bedtime;Day 2:  1 pill at breakfast, 1 pill at lunch, 1 pill after supper, 2 pills at bedtime;Day 3:  1 pill at breakfast, 1 pill at lunch, 1 pill after supper, 1 pill at bedtime;Day 4:  1 pill at breakfast, 1 pill at lunch, 1 pill at bedtime;Day 5:  1 pill at breakfast, 1 pill at bedtime;Day 6:  1 pill at breakfast Jacalyn Lefevre, MD  Active Family Member           Med Note [place],  KYLE A   Thu Jul 02, 2023  9:04 AM) Starting on 7/11  oxyCODONE-acetaminophen (PERCOCET/ROXICET) 5-325 MG tablet [id number]  Take 1 tablet by mouth every 6 (six) hours as needed for severe pain. Jacalyn Lefevre, MD  Active Family Member           Med Note Sherrie Mustache, [place] A   Thu Jul 02, 2023  9:09 AM) Hasn't started            Home Care and Equipment/Supplies: Were Home Health Services Ordered?:  NA Any new equipment or medical supplies ordered?: NA  Functional Questionnaire: Do you need assistance with bathing/showering or dressing?: No Do you need assistance with meal preparation?: No Do you need assistance with eating?: No Do you have difficulty maintaining continence: No Do you need assistance with getting out of bed/getting out of a chair/moving?: No Do you have difficulty managing or taking your medications?: No  Follow up appointments reviewed: PCP Follow-up appointment confirmed?: NA Specialist Hospital Follow-up appointment confirmed?: No Reason Specialist Follow-Up Not Confirmed: Patient has Specialist Provider Number and will Call for Appointment (Dr Wynetta Emery) Do you need transportation to your follow-up appointment?: No Do you understand care options if your condition(s) worsen?: Yes-patient verbalized understanding    SIGNATURE Karena Addison, LPN [facility] Nurse Health Advisor Direct Dial [phone]

## 2023-07-02 NOTE — Progress Notes (Signed)
SDW CALL  Patient was given pre-op instructions over the phone. The opportunity was given for the patient to ask questions. No further questions asked. Patient verbalized understanding of instructions given.   PCP - Georgina Quint, MD                                                                 Cardiologist - none  PPM/ICD - denies Device Orders -  Rep Notified -   Chest x-ray - 04/12/23 EKG - 04/12/23 Stress Test - none ECHO - none Cardiac Cath -   Sleep Study - none CPAP - no  Fasting Blood Sugar - pt does not have a meter.He states he does not check his sugar at home and does not know what is blood sugar is in the morning . Says he has not fainted or passed out because of low blood sugars. Checks Blood Sugar none times a day  Blood Thinner Instructions:na Aspirin Instructions:na  ERAS Protcol -clears until 1415 PRE-SURGERY Ensure or G2- no  COVID TEST- na   Anesthesia review: no  Patient denies shortness of breath, fever, cough and chest pain over the phone call  Surgical Instructions    Your procedure is scheduled on July 12  Report to [facility] Main Entrance "A" at 2:45 P.M., then check in with the Admitting office.  Call this number if you have problems the morning of surgery:  [phone]   Remember:  Do not eat after midnight the night before your surgery  You may drink clear liquids until 2:15pm the day of your surgery.   Clear liquids allowed are: Water, Non-Citrus Juices (without pulp), Carbonated Beverages, Clear Tea, Black Coffee ONLY (NO MILK, CREAM OR POWDERED CREAMER of any kind), and Gatorade   Take these medicines the morning of surgery with A SIP OF WATER:   As of today, STOP taking any Aspirin (unless otherwise instructed by your surgeon) Aleve, Naproxen, Ibuprofen, Motrin, Advil, Goody's, BC's, all herbal medications, fish oil, and all vitamins.  WHAT DO I DO ABOUT MY DIABETES MEDICATION?  Do not take oral diabetes medicines  (pills) the morning of surgery. Do not take glipizide (Glucotrol) or Metformin (Glucophage) the morningof surgery.  HOW TO MANAGE YOUR DIABETES BEFORE AND AFTER SURGERY  How do I manage my blood sugar before surgery? Check your blood sugar at least 4 times a day, starting 2 days before surgery, to make sure that the level is not too high or low.  Check your blood sugar the morning of your surgery when you wake up and every 2 hours until you get to the Short Stay unit.  If your blood sugar is less than 70 mg/dL, you will need to treat for low blood sugar: Do not take insulin. Treat a low blood sugar (less than 70 mg/dL) with  cup of clear juice (cranberry or apple), 4 glucose tablets, OR glucose gel. Recheck blood sugar in 15 minutes after treatment (to make sure it is greater than 70 mg/dL). If your blood sugar is not greater than 70 mg/dL on recheck, call [phone] for further instructions. Report your blood sugar to the short stay nurse when you get to Short Stay. [facility] is not responsible for any belongings or valuables. Marland Kitchen  Do NOT Smoke (Tobacco/Vaping)  24 hours prior to your procedure   Contacts, glasses, hearing aids, dentures or partials may not be worn into surgery, please bring cases for these belongings   Patients discharged the day of surgery will not be allowed to drive home, and someone needs to stay with them for 24 hours.   SURGICAL WAITING ROOM VISITATION You may have 1 visitor in the pre-op area at a time determined by the pre-op nurse. (Visitor may not switch out) Patients having surgery or a procedure in a hospital may have two support people in the waiting room. Children under the age of 54 must have an adult with them who is not the patient. They may stay in the waiting area during the procedure and may switch out with other visitors. If the patient needs to stay at the hospital during part of their recovery, the visitor guidelines for inpatient rooms  apply.  Please refer to the [facility] website for the visitor guidelines for Inpatients (after your surgery is over and you are in a regular room).   Special instructions:    Oral Hygiene is also important to reduce your risk of infection.  Remember - BRUSH YOUR TEETH THE MORNING OF SURGERY WITH YOUR REGULAR TOOTHPASTE   Day of Surgery:  Take a shower the day of or night before with antibacterial soap. Wear Clean/Comfortable clothing the morning of surgery Do not apply any deodorants/lotions.   Do not wear jewelry or makeup Do not wear lotions, powders, perfumes/colognes, or deodorant. Do not shave 48 hours prior to surgery.  Men may shave face and neck. Do not bring valuables to the hospital. Do not wear nail polish, gel polish, artificial nails, or any other type of covering on natural nails (fingers and toes) If you have artificial nails or gel coating that need to be removed by a nail salon, please have this removed prior to surgery. Artificial nails or gel coating may interfere with anesthesia's ability to adequately monitor your vital signs. Remember to brush your teeth WITH YOUR REGULAR TOOTHPASTE.

## 2023-07-03 ENCOUNTER — Ambulatory Visit (HOSPITAL_COMMUNITY): Payer: Self-pay

## 2023-07-03 ENCOUNTER — Ambulatory Visit (HOSPITAL_COMMUNITY)
Admission: RE | Admit: 2023-07-03 | Discharge: 2023-07-04 | Disposition: A | Discharge status: Home / Self Care | Payer: Self-pay | Attending: Neurosurgery | Admitting: Neurosurgery

## 2023-07-03 ENCOUNTER — Other Ambulatory Visit: Payer: Self-pay

## 2023-07-03 ENCOUNTER — Ambulatory Visit (HOSPITAL_BASED_OUTPATIENT_CLINIC_OR_DEPARTMENT_OTHER): Payer: Self-pay

## 2023-07-03 ENCOUNTER — Encounter (HOSPITAL_COMMUNITY): Payer: Self-pay | Admitting: Neurosurgery

## 2023-07-03 ENCOUNTER — Encounter (HOSPITAL_COMMUNITY)
Admission: RE | Disposition: A | Discharge status: Home / Self Care | Payer: Self-pay | Source: Home / Self Care | Attending: Neurosurgery

## 2023-07-03 DIAGNOSIS — Z87891 Personal history of nicotine dependence: Secondary | ICD-10-CM | POA: Insufficient documentation

## 2023-07-03 DIAGNOSIS — Z79899 Other long term (current) drug therapy: Secondary | ICD-10-CM | POA: Insufficient documentation

## 2023-07-03 DIAGNOSIS — M48061 Spinal stenosis, lumbar region without neurogenic claudication: Secondary | ICD-10-CM | POA: Insufficient documentation

## 2023-07-03 DIAGNOSIS — M5126 Other intervertebral disc displacement, lumbar region: Secondary | ICD-10-CM | POA: Diagnosis present

## 2023-07-03 DIAGNOSIS — M5116 Intervertebral disc disorders with radiculopathy, lumbar region: Secondary | ICD-10-CM | POA: Insufficient documentation

## 2023-07-03 DIAGNOSIS — E119 Type 2 diabetes mellitus without complications: Secondary | ICD-10-CM | POA: Insufficient documentation

## 2023-07-03 DIAGNOSIS — Z7984 Long term (current) use of oral hypoglycemic drugs: Secondary | ICD-10-CM | POA: Insufficient documentation

## 2023-07-03 HISTORY — PX: LUMBAR LAMINECTOMY/DECOMPRESSION MICRODISCECTOMY: SHX5026

## 2023-07-03 LAB — GLUCOSE, CAPILLARY
Glucose-Capillary: 160 mg/dL — ABNORMAL HIGH (ref 70–99)
Glucose-Capillary: 155 mg/dL — ABNORMAL HIGH (ref 70–99)
Glucose-Capillary: 231 mg/dL — ABNORMAL HIGH (ref 70–99)

## 2023-07-03 LAB — SURGICAL PCR SCREEN
MRSA, PCR: NEGATIVE
Staphylococcus aureus: NEGATIVE

## 2023-07-03 SURGERY — LUMBAR LAMINECTOMY/DECOMPRESSION MICRODISCECTOMY 2 LEVELS
Anesthesia: General | Site: Spine Lumbar | Laterality: Bilateral

## 2023-07-03 MED ORDER — THROMBIN 20000 UNITS EX SOLR: CUTANEOUS | Status: DC | PRN

## 2023-07-03 MED ORDER — DEXAMETHASONE SODIUM PHOSPHATE 10 MG/ML IJ SOLN
INTRAMUSCULAR | Status: AC
  Filled 2023-07-03: qty 1

## 2023-07-03 MED ORDER — HYDROCODONE-ACETAMINOPHEN 5-325 MG PO TABS
2.0000 | ORAL_TABLET | ORAL | Status: DC | PRN
  Administered 2023-07-03 – 2023-07-04 (×4): 2 via ORAL
  Filled 2023-07-03 (×4): qty 2

## 2023-07-03 MED ORDER — PHENYLEPHRINE HCL-NACL 20-0.9 MG/250ML-% IV SOLN
INTRAVENOUS | Status: DC | PRN
  Administered 2023-07-03: 10 ug/min via INTRAVENOUS

## 2023-07-03 MED ORDER — FENTANYL CITRATE (PF) 250 MCG/5ML IJ SOLN
INTRAMUSCULAR | Status: AC
  Filled 2023-07-03: qty 5

## 2023-07-03 MED ORDER — ACETAMINOPHEN 650 MG RE SUPP: 650.0000 mg | RECTAL | Status: DC | PRN

## 2023-07-03 MED ORDER — INSULIN ASPART 100 UNIT/ML IJ SOLN: 0.0000 [IU] | INTRAMUSCULAR | Status: DC | PRN

## 2023-07-03 MED ORDER — MENTHOL 3 MG MT LOZG: 1.0000 | LOZENGE | OROMUCOSAL | Status: DC | PRN

## 2023-07-03 MED ORDER — DEXAMETHASONE SODIUM PHOSPHATE 10 MG/ML IJ SOLN
INTRAMUSCULAR | Status: DC | PRN
  Administered 2023-07-03: 10 mg via INTRAVENOUS

## 2023-07-03 MED ORDER — LIDOCAINE 2% (20 MG/ML) 5 ML SYRINGE
INTRAMUSCULAR | Status: DC | PRN
  Administered 2023-07-03: 60 mg via INTRAVENOUS

## 2023-07-03 MED ORDER — SUGAMMADEX SODIUM 200 MG/2ML IV SOLN
INTRAVENOUS | Status: DC | PRN
  Administered 2023-07-03: 146 mg via INTRAVENOUS

## 2023-07-03 MED ORDER — ACETAMINOPHEN 500 MG PO TABS
ORAL_TABLET | ORAL | Status: AC
  Filled 2023-07-03: qty 2

## 2023-07-03 MED ORDER — LACTATED RINGERS IV SOLN: INTRAVENOUS | Status: DC | PRN

## 2023-07-03 MED ORDER — CYCLOBENZAPRINE HCL 10 MG PO TABS
10.0000 mg | ORAL_TABLET | Freq: Three times a day (TID) | ORAL | Status: DC | PRN
  Administered 2023-07-03 – 2023-07-04 (×2): 10 mg via ORAL
  Filled 2023-07-03 (×2): qty 1

## 2023-07-03 MED ORDER — SODIUM CHLORIDE 0.9% FLUSH: 3.0000 mL | INTRAVENOUS | Status: DC | PRN

## 2023-07-03 MED ORDER — INSULIN ASPART 100 UNIT/ML IJ SOLN: 0.0000 [IU] | Freq: Three times a day (TID) | INTRAMUSCULAR | Status: DC

## 2023-07-03 MED ORDER — PHENOL 1.4 % MT LIQD: 1.0000 | OROMUCOSAL | Status: DC | PRN

## 2023-07-03 MED ORDER — MIDAZOLAM HCL 2 MG/2ML IJ SOLN
INTRAMUSCULAR | Status: DC | PRN
  Administered 2023-07-03: 2 mg via INTRAVENOUS

## 2023-07-03 MED ORDER — LIDOCAINE 2% (20 MG/ML) 5 ML SYRINGE
INTRAMUSCULAR | Status: AC
  Filled 2023-07-03: qty 5

## 2023-07-03 MED ORDER — BUPIVACAINE HCL (PF) 0.25 % IJ SOLN
INTRAMUSCULAR | Status: DC | PRN
  Administered 2023-07-03: 10 mL

## 2023-07-03 MED ORDER — PROPOFOL 10 MG/ML IV BOLUS
INTRAVENOUS | Status: DC | PRN
Start: 2023-07-03 — End: 2023-07-03
  Administered 2023-07-03: 120 mg via INTRAVENOUS

## 2023-07-03 MED ORDER — PANTOPRAZOLE SODIUM 40 MG PO TBEC
40.0000 mg | DELAYED_RELEASE_TABLET | Freq: Every day | ORAL | Status: DC
  Administered 2023-07-03: 40 mg via ORAL
  Filled 2023-07-03: qty 1

## 2023-07-03 MED ORDER — ORAL CARE MOUTH RINSE: 15.0000 mL | Freq: Once | OROMUCOSAL | Status: AC

## 2023-07-03 MED ORDER — SODIUM CHLORIDE 0.9% FLUSH: 3.0000 mL | Freq: Two times a day (BID) | INTRAVENOUS | Status: DC

## 2023-07-03 MED ORDER — MIDAZOLAM HCL 2 MG/2ML IJ SOLN
INTRAMUSCULAR | Status: AC
  Filled 2023-07-03: qty 2

## 2023-07-03 MED ORDER — KETAMINE HCL 10 MG/ML IJ SOLN
INTRAMUSCULAR | Status: DC | PRN
  Administered 2023-07-03: 30 mg via INTRAVENOUS

## 2023-07-03 MED ORDER — OXYCODONE-ACETAMINOPHEN 5-325 MG PO TABS: 1.0000 | ORAL_TABLET | Freq: Four times a day (QID) | ORAL | Status: DC | PRN

## 2023-07-03 MED ORDER — CHLORHEXIDINE GLUCONATE 0.12 % MT SOLN
15.0000 mL | Freq: Once | OROMUCOSAL | Status: AC
  Administered 2023-07-03: 15 mL via OROMUCOSAL
  Filled 2023-07-03: qty 15

## 2023-07-03 MED ORDER — GLIPIZIDE 5 MG PO TABS
5.0000 mg | ORAL_TABLET | Freq: Two times a day (BID) | ORAL | Status: DC
  Administered 2023-07-04: 5 mg via ORAL
  Filled 2023-07-03: qty 1

## 2023-07-03 MED ORDER — PANTOPRAZOLE SODIUM 40 MG IV SOLR: 40.0000 mg | Freq: Every day | INTRAVENOUS | Status: DC

## 2023-07-03 MED ORDER — METFORMIN HCL ER 500 MG PO TB24
1000.0000 mg | ORAL_TABLET | Freq: Two times a day (BID) | ORAL | Status: DC
  Administered 2023-07-04: 1000 mg via ORAL
  Filled 2023-07-03: qty 2

## 2023-07-03 MED ORDER — HYDROMORPHONE HCL 1 MG/ML IJ SOLN: 0.5000 mg | INTRAMUSCULAR | Status: DC | PRN

## 2023-07-03 MED ORDER — KETAMINE HCL 50 MG/5ML IJ SOSY
PREFILLED_SYRINGE | INTRAMUSCULAR | Status: AC
  Filled 2023-07-03: qty 5

## 2023-07-03 MED ORDER — LIDOCAINE-EPINEPHRINE 1 %-1:100000 IJ SOLN
INTRAMUSCULAR | Status: DC | PRN
  Administered 2023-07-03: 10 mL

## 2023-07-03 MED ORDER — VICKS VAPORUB 4.73-1.2-2.6 % EX OINT: TOPICAL_OINTMENT | Freq: Every day | CUTANEOUS | Status: DC | PRN

## 2023-07-03 MED ORDER — THROMBIN 20000 UNITS EX SOLR
CUTANEOUS | Status: AC
  Filled 2023-07-03: qty 20000

## 2023-07-03 MED ORDER — LACTATED RINGERS IV SOLN: INTRAVENOUS | Status: DC

## 2023-07-03 MED ORDER — ONDANSETRON HCL 4 MG/2ML IJ SOLN: 4.0000 mg | Freq: Four times a day (QID) | INTRAMUSCULAR | Status: DC | PRN

## 2023-07-03 MED ORDER — BUPIVACAINE HCL (PF) 0.25 % IJ SOLN
INTRAMUSCULAR | Status: AC
  Filled 2023-07-03: qty 30

## 2023-07-03 MED ORDER — AMISULPRIDE (ANTIEMETIC) 5 MG/2ML IV SOLN: 10.0000 mg | Freq: Once | INTRAVENOUS | Status: DC | PRN

## 2023-07-03 MED ORDER — LIDOCAINE-EPINEPHRINE 1 %-1:100000 IJ SOLN
INTRAMUSCULAR | Status: AC
  Filled 2023-07-03: qty 1

## 2023-07-03 MED ORDER — FENTANYL CITRATE (PF) 100 MCG/2ML IJ SOLN
INTRAMUSCULAR | Status: AC
  Filled 2023-07-03: qty 2

## 2023-07-03 MED ORDER — ACETAMINOPHEN 325 MG PO TABS
650.0000 mg | ORAL_TABLET | ORAL | Status: DC | PRN
  Administered 2023-07-03: 650 mg via ORAL
  Filled 2023-07-03: qty 2

## 2023-07-03 MED ORDER — ALUM & MAG HYDROXIDE-SIMETH 200-200-20 MG/5ML PO SUSP: 30.0000 mL | Freq: Four times a day (QID) | ORAL | Status: DC | PRN

## 2023-07-03 MED ORDER — ONDANSETRON HCL 4 MG/2ML IJ SOLN
INTRAMUSCULAR | Status: DC | PRN
  Administered 2023-07-03: 4 mg via INTRAVENOUS

## 2023-07-03 MED ORDER — ATORVASTATIN CALCIUM 40 MG PO TABS: 40.0000 mg | ORAL_TABLET | Freq: Every day | ORAL | Status: DC

## 2023-07-03 MED ORDER — ONDANSETRON HCL 4 MG PO TABS: 4.0000 mg | ORAL_TABLET | Freq: Four times a day (QID) | ORAL | Status: DC | PRN

## 2023-07-03 MED ORDER — ONDANSETRON HCL 4 MG/2ML IJ SOLN: 4.0000 mg | Freq: Once | INTRAMUSCULAR | Status: DC | PRN

## 2023-07-03 MED ORDER — INSULIN ASPART 100 UNIT/ML IJ SOLN
0.0000 [IU] | Freq: Three times a day (TID) | INTRAMUSCULAR | Status: DC
  Administered 2023-07-04: 3 [IU] via SUBCUTANEOUS

## 2023-07-03 MED ORDER — HYDROCODONE-ACETAMINOPHEN 5-325 MG PO TABS: 1.0000 | ORAL_TABLET | Freq: Four times a day (QID) | ORAL | Status: DC | PRN

## 2023-07-03 MED ORDER — CYCLOBENZAPRINE HCL 5 MG PO TABS: 5.0000 mg | ORAL_TABLET | Freq: Three times a day (TID) | ORAL | Status: DC | PRN

## 2023-07-03 MED ORDER — ONDANSETRON HCL 4 MG/2ML IJ SOLN
INTRAMUSCULAR | Status: AC
  Filled 2023-07-03: qty 2

## 2023-07-03 MED ORDER — SODIUM CHLORIDE 0.9 % IV SOLN: 250.0000 mL | INTRAVENOUS | Status: DC

## 2023-07-03 MED ORDER — FENTANYL CITRATE (PF) 250 MCG/5ML IJ SOLN
INTRAMUSCULAR | Status: DC | PRN
  Administered 2023-07-03: 150 ug via INTRAVENOUS

## 2023-07-03 MED ORDER — CEFAZOLIN SODIUM-DEXTROSE 2-4 GM/100ML-% IV SOLN
INTRAVENOUS | Status: AC
  Filled 2023-07-03: qty 100

## 2023-07-03 MED ORDER — ROCURONIUM BROMIDE 10 MG/ML (PF) SYRINGE
PREFILLED_SYRINGE | INTRAVENOUS | Status: DC | PRN
  Administered 2023-07-03 (×2): 50 mg via INTRAVENOUS

## 2023-07-03 MED ORDER — PROPOFOL 10 MG/ML IV BOLUS
INTRAVENOUS | Status: AC
  Filled 2023-07-03: qty 20

## 2023-07-03 MED ORDER — 0.9 % SODIUM CHLORIDE (POUR BTL) OPTIME
TOPICAL | Status: DC | PRN
  Administered 2023-07-03: 1000 mL

## 2023-07-03 MED ORDER — METHOCARBAMOL 750 MG PO TABS
750.0000 mg | ORAL_TABLET | Freq: Four times a day (QID) | ORAL | Status: DC
  Administered 2023-07-03 – 2023-07-04 (×3): 750 mg via ORAL
  Filled 2023-07-03 (×3): qty 1

## 2023-07-03 MED ORDER — INSULIN ASPART 100 UNIT/ML IJ SOLN
0.0000 [IU] | Freq: Every day | INTRAMUSCULAR | Status: DC
  Administered 2023-07-03: 2 [IU] via SUBCUTANEOUS

## 2023-07-03 MED ORDER — PHENYLEPHRINE 80 MCG/ML (10ML) SYRINGE FOR IV PUSH (FOR BLOOD PRESSURE SUPPORT)
PREFILLED_SYRINGE | INTRAVENOUS | Status: AC
  Filled 2023-07-03: qty 20

## 2023-07-03 MED ORDER — CEFAZOLIN SODIUM-DEXTROSE 2-4 GM/100ML-% IV SOLN
2.0000 g | Freq: Three times a day (TID) | INTRAVENOUS | Status: AC
  Administered 2023-07-03 – 2023-07-04 (×2): 2 g via INTRAVENOUS
  Filled 2023-07-03 (×2): qty 100

## 2023-07-03 MED ORDER — FENTANYL CITRATE (PF) 100 MCG/2ML IJ SOLN
25.0000 ug | INTRAMUSCULAR | Status: DC | PRN
  Administered 2023-07-03 (×3): 50 ug via INTRAVENOUS

## 2023-07-03 MED ORDER — ACETAMINOPHEN 500 MG PO TABS
1000.0000 mg | ORAL_TABLET | Freq: Once | ORAL | Status: AC
  Administered 2023-07-03: 1000 mg via ORAL
  Filled 2023-07-03: qty 2

## 2023-07-03 SURGICAL SUPPLY — 49 items
ADH SKN CLS APL DERMABOND .7 (GAUZE/BANDAGES/DRESSINGS) ×1
APL SKNCLS STERI-STRIP NONHPOA (GAUZE/BANDAGES/DRESSINGS) ×1
BAG COUNTER SPONGE SURGICOUNT (BAG) ×1 IMPLANT
BAG SPNG CNTER NS LX DISP (BAG) ×1
BENZOIN TINCTURE PRP APPL 2/3 (GAUZE/BANDAGES/DRESSINGS) ×1 IMPLANT
BLADE CLIPPER SURG (BLADE) IMPLANT
BLADE SURG 11 STRL SS (BLADE) ×1 IMPLANT
BUR CUTTER 7.0 ROUND (BURR) ×1 IMPLANT
BUR MATCHSTICK NEURO 3.0 LAGG (BURR) ×1 IMPLANT
CANISTER SUCT 3000ML PPV (MISCELLANEOUS) ×1 IMPLANT
DERMABOND ADVANCED .7 DNX12 (GAUZE/BANDAGES/DRESSINGS) ×1 IMPLANT
DRAPE HALF SHEET 40X57 (DRAPES) IMPLANT
DRAPE LAPAROTOMY 100X72X124 (DRAPES) ×1 IMPLANT
DRAPE MICROSCOPE SLANT 54X150 (MISCELLANEOUS) ×1 IMPLANT
DRAPE SURG 17X23 STRL (DRAPES) ×1 IMPLANT
DRSG OPSITE POSTOP 4X6 (GAUZE/BANDAGES/DRESSINGS) IMPLANT
DURAPREP 26ML APPLICATOR (WOUND CARE) ×1 IMPLANT
ELECT REM PT RETURN 9FT ADLT (ELECTROSURGICAL) ×1
ELECTRODE REM PT RTRN 9FT ADLT (ELECTROSURGICAL) ×1 IMPLANT
GAUZE 4X4 16PLY ~~LOC~~+RFID DBL (SPONGE) IMPLANT
GAUZE SPONGE 4X4 12PLY STRL (GAUZE/BANDAGES/DRESSINGS) ×1 IMPLANT
GLOVE BIO SURGEON STRL SZ7 (GLOVE) IMPLANT
GLOVE BIO SURGEON STRL SZ8 (GLOVE) ×1 IMPLANT
GLOVE BIOGEL PI IND STRL 7.0 (GLOVE) IMPLANT
GLOVE EXAM NITRILE XL STR (GLOVE) IMPLANT
GLOVE INDICATOR 8.5 STRL (GLOVE) ×2 IMPLANT
GOWN STRL REUS W/ TWL LRG LVL3 (GOWN DISPOSABLE) ×1 IMPLANT
GOWN STRL REUS W/ TWL XL LVL3 (GOWN DISPOSABLE) ×2 IMPLANT
GOWN STRL REUS W/TWL 2XL LVL3 (GOWN DISPOSABLE) IMPLANT
GOWN STRL REUS W/TWL LRG LVL3 (GOWN DISPOSABLE) ×1
GOWN STRL REUS W/TWL XL LVL3 (GOWN DISPOSABLE) ×2
KIT BASIN OR (CUSTOM PROCEDURE TRAY) ×1 IMPLANT
KIT TURNOVER KIT B (KITS) ×1 IMPLANT
NDL HYPO 22X1.5 SAFETY MO (MISCELLANEOUS) ×1 IMPLANT
NDL SPNL 22GX3.5 QUINCKE BK (NEEDLE) ×1 IMPLANT
NEEDLE HYPO 22X1.5 SAFETY MO (MISCELLANEOUS) ×1 IMPLANT
NEEDLE SPNL 22GX3.5 QUINCKE BK (NEEDLE) ×1 IMPLANT
NS IRRIG 1000ML POUR BTL (IV SOLUTION) ×1 IMPLANT
PACK LAMINECTOMY NEURO (CUSTOM PROCEDURE TRAY) ×1 IMPLANT
SPIKE FLUID TRANSFER (MISCELLANEOUS) ×1 IMPLANT
SPONGE SURGIFOAM ABS GEL SZ50 (HEMOSTASIS) ×1 IMPLANT
STRIP CLOSURE SKIN 1/2X4 (GAUZE/BANDAGES/DRESSINGS) ×1 IMPLANT
SUT VIC AB 0 CT1 18XCR BRD8 (SUTURE) ×1 IMPLANT
SUT VIC AB 0 CT1 8-18 (SUTURE) ×1
SUT VIC AB 2-0 CT1 18 (SUTURE) ×1 IMPLANT
SUT VICRYL 4-0 PS2 18IN ABS (SUTURE) ×1 IMPLANT
TOWEL GREEN STERILE (TOWEL DISPOSABLE) ×1 IMPLANT
TOWEL GREEN STERILE FF (TOWEL DISPOSABLE) ×1 IMPLANT
WATER STERILE IRR 1000ML POUR (IV SOLUTION) ×1 IMPLANT

## 2023-07-03 NOTE — Anesthesia Preprocedure Evaluation (Addendum)
Anesthesia Evaluation  Patient identified by MRN, date of birth, ID band Patient awake    Reviewed: Allergy & Precautions, NPO status , Patient's Chart, lab work & pertinent test results  Airway Mallampati: II  TM Distance: >3 FB Neck ROM: Full    Dental  (+) Dental Advisory Given, Poor Dentition, Chipped,    Pulmonary former smoker   Pulmonary exam normal breath sounds clear to auscultation       Cardiovascular negative cardio ROS Normal cardiovascular exam Rhythm:Regular Rate:Normal     Neuro/Psych  Neuromuscular disease (Lumbar stenosis)    GI/Hepatic negative GI ROS, Neg liver ROS,,,  Endo/Other  diabetes, Type 2, Oral Hypoglycemic Agents    Renal/GU negative Renal ROS     Musculoskeletal negative musculoskeletal ROS (+)    Abdominal   Peds  Hematology negative hematology ROS (+)   Anesthesia Other Findings Day of surgery medications reviewed with the patient.  Reproductive/Obstetrics                             Anesthesia Physical Anesthesia Plan  ASA: 2  Anesthesia Plan: General   Post-op Pain Management: Tylenol PO (pre-op)* and Ketamine IV*   Induction: Intravenous  PONV Risk Score and Plan: 3 and Midazolam, Dexamethasone and Ondansetron  Airway Management Planned: Oral ETT  Additional Equipment:   Intra-op Plan:   Post-operative Plan: Extubation in OR  Informed Consent: I have reviewed the patients History and Physical, chart, labs and discussed the procedure including the risks, benefits and alternatives for the proposed anesthesia with the patient or authorized representative who has indicated his/her understanding and acceptance.     Dental advisory given and Interpreter used for interveiw  Plan Discussed with: CRNA  Anesthesia Plan Comments:        Anesthesia Quick Evaluation

## 2023-07-03 NOTE — Anesthesia Procedure Notes (Signed)
Procedure Name: Intubation Date/Time: 07/03/2023 5:15 PM  Performed by: Collene Schlichter, MDPre-anesthesia Checklist: Patient identified, Emergency Drugs available, Suction available and Patient being monitored Patient Re-evaluated:Patient Re-evaluated prior to induction Oxygen Delivery Method: Circle system utilized Preoxygenation: Pre-oxygenation with 100% oxygen Induction Type: IV induction Ventilation: Mask ventilation without difficulty Laryngoscope Size: Miller and 2 Grade View: Grade I Tube type: Oral Tube size: 7.5 mm Number of attempts: 1 Airway Equipment and Method: Stylet and Oral airway Placement Confirmation: ETT inserted through vocal cords under direct vision, positive ETCO2 and breath sounds checked- equal and bilateral Secured at: 22 cm Tube secured with: Tape Dental Injury: Teeth and Oropharynx as per pre-operative assessment

## 2023-07-03 NOTE — Transfer of Care (Signed)
Immediate Anesthesia Transfer of Care Note  Patient: Justin Stein  Procedure(s) Performed: BILATERAL LUMBAR TWO-THREE, LUMBAR THREE-FOUR SUBLAMINAR DECOMPRESSION WITH LEFT LUMBAR THREE-FOUR MICRODISKECTOMY (Bilateral: Spine Lumbar)  Patient Location: PACU  Anesthesia Type:General  Level of Consciousness: responds to stimulation  Airway & Oxygen Therapy: Patient Spontanous Breathing  Post-op Assessment: Report given to RN and Post -op Vital signs reviewed and stable  Post vital signs: Reviewed and stable  Last Vitals:  Vitals Value Taken Time  BP 137/74 07/03/23 1901  Temp    Pulse 49 07/03/23 1906  Resp 11 07/03/23 1906  SpO2 98 % 07/03/23 1906  Vitals shown include unfiled device data.  Last Pain:  Vitals:   07/03/23 1514  TempSrc:   PainSc: 0-No pain         Complications: No notable events documented.

## 2023-07-03 NOTE — H&P (Signed)
Justin Stein is an 67 y.o. male.   Chief Complaint: Back bilateral hip and leg pain HPI: 67 year old non-English-speaking Hispanic male with progressive worsening back and bilateral hip and leg pain with numbness and tingling intermittent weakness and fatigability.  Workup has shown severe spinal stenosis with a large herniation at 3 4 and stenosis at 2 3.  Due to his progression of clinical syndrome imaging findings of failure of conservative treatment I recommended decompressive laminectomy and discectomy at L3-4 and decompressive laminectomy at L2-3.  Extensive went of the risks and benefits of the operation with him as well as perioperative course expectations of outcome and alternatives of surgery he understands and agrees to proceed forward.  Past Medical History:  Diagnosis Date   Diabetes mellitus without complication (HCC)     Past Surgical History:  Procedure Laterality Date   NO PAST SURGERIES      History reviewed. No pertinent family history. Social History:  reports that he has quit smoking. He has never used smokeless tobacco. He reports that he does not drink alcohol and does not use drugs.  Allergies: No Known Allergies  Medications Prior to Admission  Medication Sig Dispense Refill   atorvastatin (LIPITOR) 40 MG tablet Take 1 tablet (40 mg total) by mouth daily. 90 tablet 1   Camphor-Eucalyptus-Menthol (VICKS VAPORUB EX) Apply 1 Application topically daily as needed (pain).     cyclobenzaprine (FLEXERIL) 5 MG tablet Take 1 tablet (5 mg total) by mouth 3 (three) times daily as needed for muscle spasms. 20 tablet 3   glipiZIDE (GLUCOTROL) 5 MG tablet Take 1 tablet (5 mg total) by mouth 2 (two) times daily before a meal. 180 tablet 3   metFORMIN (GLUCOPHAGE-XR) 500 MG 24 hr tablet Take 2 tablets (1,000 mg total) by mouth 2 (two) times daily with a meal. 360 tablet 1   methylPREDNISolone (MEDROL DOSEPAK) 4 MG TBPK tablet Take by mouth daily. Day 1:  2 pills at  breakfast, 1 pill at lunch, 1 pill after supper, 2 pills at bedtime;Day 2:  1 pill at breakfast, 1 pill at lunch, 1 pill after supper, 2 pills at bedtime;Day 3:  1 pill at breakfast, 1 pill at lunch, 1 pill after supper, 1 pill at bedtime;Day 4:  1 pill at breakfast, 1 pill at lunch, 1 pill at bedtime;Day 5:  1 pill at breakfast, 1 pill at bedtime;Day 6:  1 pill at breakfast 21 tablet 0   blood glucose meter kit and supplies KIT Dispense based on patient and insurance preference. Use up to four times daily as directed. (FOR ICD-9 250.00, 250.01). (Patient not taking: Reported on 06/10/2023) 1 each 0   gabapentin (NEURONTIN) 300 MG capsule Take 1 capsule (300 mg total) by mouth 2 (two) times daily. (Patient not taking: Reported on 07/02/2023) 90 capsule 3   HYDROcodone-acetaminophen (NORCO/VICODIN) 5-325 MG tablet Take 1 tablet by mouth every 6 (six) hours as needed for pain (Patient not taking: Reported on 07/02/2023) 40 tablet 0   methocarbamol (ROBAXIN) 750 MG tablet Take 1 tablet (750 mg total) by mouth every 6 (six) hours. (Patient not taking: Reported on 07/02/2023) 40 tablet 0   oxyCODONE-acetaminophen (PERCOCET/ROXICET) 5-325 MG tablet Take 1 tablet by mouth every 6 (six) hours as needed for severe pain. 15 tablet 0    Results for orders placed or performed during the hospital encounter of 07/03/23 (from the past 48 hour(s))  Glucose, capillary     Status: Abnormal   Collection Time: 07/03/23  2:47 PM  Result Value Ref Range   Glucose-Capillary 160 (H) 70 - 99 mg/dL    Comment: Glucose reference range applies only to samples taken after fasting for at least 8 hours.   Comment 1 Notify RN    Comment 2 Document in Chart    No results found.  Review of Systems  Musculoskeletal:  Positive for back pain.  Neurological:  Positive for numbness.    Blood pressure 139/73, pulse (!) 52, temperature 98 F (36.7 C), temperature source Oral, resp. rate 20, height 5\' 5"  (1.651 m), weight 73 kg, SpO2  97%. Physical Exam HENT:     Head: Normocephalic.     Right Ear: Tympanic membrane normal.     Nose: Nose normal.  Eyes:     Pupils: Pupils are equal, round, and reactive to light.  Cardiovascular:     Rate and Rhythm: Normal rate.  Abdominal:     General: Abdomen is flat.  Musculoskeletal:        General: Normal range of motion.     Cervical back: Normal range of motion.  Neurological:     Comments: Strength is 5 out of 5 iliopsoas, quad quads, hamstrings, gastroc, tibialis, and EHL.      Assessment/Plan 67 year old presents for decompressive laminectomy and discectomy at L3-4  Mariam Dollar, MD 07/03/2023, 4:22 PM

## 2023-07-03 NOTE — Anesthesia Postprocedure Evaluation (Signed)
Anesthesia Post Note  Patient: Justin Stein  Procedure(s) Performed: BILATERAL LUMBAR TWO-THREE, LUMBAR THREE-FOUR SUBLAMINAR DECOMPRESSION WITH LEFT LUMBAR THREE-FOUR MICRODISKECTOMY (Bilateral: Spine Lumbar)     Patient location during evaluation: PACU Anesthesia Type: General Level of consciousness: awake and alert Pain management: pain level controlled Vital Signs Assessment: post-procedure vital signs reviewed and stable Respiratory status: spontaneous breathing, nonlabored ventilation and respiratory function stable Cardiovascular status: blood pressure returned to baseline and stable Postop Assessment: no apparent nausea or vomiting Anesthetic complications: no  No notable events documented.  Last Vitals:  Vitals:   07/03/23 2000 07/03/23 2026  BP: 122/72 (!) 149/68  Pulse: (!) 51 (!) 46  Resp: 12 18  Temp: (!) 36.4 C 36.4 C  SpO2: 97% 100%    Last Pain:  Vitals:   07/03/23 2026  TempSrc: Oral  PainSc:                  Amarachukwu Lakatos,W. EDMOND

## 2023-07-03 NOTE — Progress Notes (Signed)
Video interpreter Marquita Palms utilized to orient patient to PACU and discuss plan of care.  Interpreter Raquel at bedside as well.  All questions answered.  VSS.  Will continue to monitor.

## 2023-07-03 NOTE — Op Note (Signed)
Preoperative diagnosis: Lumbar spinal stenosis L2-3 L3-4 bilateral L3-L4 radiculopathies hernia nucleus pulposus L3-4 left  Postoperative diagnosis: Same  Procedure: #1 decompressive lumbar laminectomies L2-3 L3-4 with partial medial facetectomies and foraminotomies of the L3 and L4 nerve roots bilaterally.  2.  Microscopic discectomy L3-4 on the left utilizing the operating microscope microdissection with discectomy..  Surgeon: Donalee Citrin.  Anesthesia: General.  EBL: Minimal.  HPI: 67 year old gentleman with progressive worsening back bilateral hip and leg pain consistent with L3 and L4 nerve root pattern.  Workup revealed severe spinal stenosis primarily at L3-4 with a ruptured disc on the left but also at L2-3 due to the patient's progression of clinical syndrome imaging findings of a conservative treatment I recommended decompressive laminectomies at both levels with a microdiscectomy from the left I extensively reviewed the risks and benefits of the operation with the patient as well as perioperative course expectations of outcome and alternatives of surgery and he understood and agreed to proceed forward.Marland Kitchen  Operative procedure: Patient was brought into the OR was induced under general anesthesia positioned prone the Wilson frame his back was prepped and draped in routine sterile fashion utilizing anatomical landmarks incision was drawn out the midline and after infiltration of 10 cc lidocaine with epi Bovie electrocautery was used take down subcutaneous tissue and subperiosteal dissection was carried lamina of L3 and L4.  Interoperative x-ray confirmed application of the 4 5 disc base so attention was taken to interspaces above this I exposed the lamina of L2-L3 and the remainder of L4 remove the spinous process of L3 and performed a central decompression with complete removal of the L3 lamina partially removed the inferior aspect of the 2 lamina and superior of the 4 lamina was also removed.  I  tried to preserve most of the medial facet on the right at L3-4 as I was coming in from the left and would have to do more of a facetectomy for the discectomy at that side there was some laxity on the left L3-4 facet but the right one seem to be intact so I do not think it was overtly unstable.  But I performed foraminotomies of the L3 nerve root there was marked hourglass compression at the 3 4 disc base and removed all the stenosis then draped the operating microscope and under microscopic illumination identified the disc base under bit the medial facets and more unroofed the foramen and the large disc herniation still partially contained with the ligament was medial identified ligament was incised disc base was cleaned out with procedure rongeurs and Epstein curettes.  At the end of discectomy there is no further stenosis ventrally on the thecal sac I passed a coronary dilator easily out the 3 foramina and 4 foramina bilaterally as well as superiorly and inferiorly in the central canal.  The wounds and copiously again meticulous hemostasis was maintained Gelfoam was ON top of the dura a medium Hemovac drain was placed and the wound was closed in layers with interrupted Vicryl and a running 4 subcuticular.  Dermabond benzoin Steri-Strips and a sterile dressing was applied patient recovery in stable condition.  At the end the case all needle counts and sponge counts were correct.

## 2023-07-04 ENCOUNTER — Encounter (HOSPITAL_COMMUNITY): Payer: Self-pay | Admitting: Neurosurgery

## 2023-07-04 ENCOUNTER — Other Ambulatory Visit: Payer: Self-pay

## 2023-07-04 ENCOUNTER — Other Ambulatory Visit (HOSPITAL_COMMUNITY): Payer: Self-pay

## 2023-07-04 LAB — GLUCOSE, CAPILLARY: Glucose-Capillary: 198 mg/dL — ABNORMAL HIGH (ref 70–99)

## 2023-07-04 MED ORDER — CYCLOBENZAPRINE HCL 10 MG PO TABS: 10.0000 mg | ORAL_TABLET | Freq: Three times a day (TID) | ORAL | 0 refills | Status: DC | PRN

## 2023-07-04 MED ORDER — HYDROCODONE-ACETAMINOPHEN 10-325 MG PO TABS
1.0000 | ORAL_TABLET | ORAL | 0 refills | Status: DC | PRN
  Filled 2023-07-04: qty 30, 5d supply, fill #0

## 2023-07-04 MED ORDER — CYCLOBENZAPRINE HCL 10 MG PO TABS
10.0000 mg | ORAL_TABLET | Freq: Three times a day (TID) | ORAL | 0 refills | Status: DC | PRN
  Filled 2023-07-04: qty 30, 10d supply, fill #0

## 2023-07-04 MED ORDER — HYDROCODONE-ACETAMINOPHEN 10-325 MG PO TABS: 1.0000 | ORAL_TABLET | ORAL | 0 refills | Status: DC | PRN

## 2023-07-04 NOTE — Discharge Summary (Signed)
Discharge Summary  Date of Admission: 07/03/2023  Date of Discharge: 07/04/23  Attending Physician: Donalee Citrin, MD  Hospital Course: Patient was admitted following an uncomplicated L2-4 laminectomy / discectomy. They were recovered in PACU and transferred to [facility]. Their preop symptoms were improved, their hospital course was uncomplicated and the patient was discharged home today. They will follow up in clinic with Dr. Wynetta Emery in clinic in 2 weeks.  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

## 2023-07-04 NOTE — Progress Notes (Signed)
Patient alert and oriented, patient ambulate, void. Hemovac drain removed per order, surgical site clean and dry. D/c instructions explain using Hispanic interpreter and copy in english and Spanish to the family. All questions answered.

## 2023-07-04 NOTE — Evaluation (Signed)
Occupational Therapy Evaluation Patient Details Name: Justin Stein MRN: [id number] DOB: 10-Oct-1956 Today's Date: 07/04/2023   History of Present Illness Pt is a 67 y.o. male s/p L2-4 laminectomy / discectomy.  PMH of diabetes   Clinical Impression   Pt currently at supervision level for mobility progression toward modified independent with continued mobilization.  He lives with his daughter who can provide 24 hour assist and will provide help with LB dressing as well.  Pt able to ambulate with and without the RW greater than 300' and complete 5 steps at supervision level with rail on the left side.  No further acute or post acute OT needs at this time.        Recommendations for follow up therapy are one component of a multi-disciplinary discharge planning process, led by the attending physician.  Recommendations may be updated based on patient status, additional functional criteria and insurance authorization.   Assistance Recommended at Discharge PRN  Patient can return home with the following A little help with bathing/dressing/bathroom;Assistance with cooking/housework;Assist for transportation;Help with stairs or ramp for entrance    Functional Status Assessment  Patient has had a recent decline in their functional status and demonstrates the ability to make significant improvements in function in a reasonable and predictable amount of time.  Equipment Recommendations  None recommended by OT       Precautions / Restrictions Precautions Precautions: Fall;Back Precaution Booklet Issued: Yes (comment) Restrictions Weight Bearing Restrictions: No      Mobility Bed Mobility Overal bed mobility: Needs Assistance Bed Mobility: Supine to Sit, Sit to Supine     Supine to sit: Supervision Sit to supine: Supervision        Transfers Overall transfer level: Needs assistance   Transfers: Bed to chair/wheelchair/BSC, Sit to/from Stand Sit to Stand: Supervision      Step pivot transfers: Supervision            Balance Overall balance assessment: Needs assistance   Sitting balance-Leahy Scale: Good     Standing balance support: During functional activity Standing balance-Leahy Scale: Good                             ADL either performed or assessed with clinical judgement   ADL Overall ADL's : Needs assistance/impaired Eating/Feeding: Independent;Sitting   Grooming: Wash/dry hands;Wash/dry face;Standing;Supervision/safety Grooming Details (indicate cue type and reason): simulated Upper Body Bathing: Set up;Sitting Upper Body Bathing Details (indicate cue type and reason): simulated Lower Body Bathing: Minimal assistance;Sit to/from stand Lower Body Bathing Details (indicate cue type and reason): simulated Upper Body Dressing : Set up;Sitting Upper Body Dressing Details (indicate cue type and reason): simulated Lower Body Dressing: Moderate assistance;Sit to/from stand Lower Body Dressing Details (indicate cue type and reason): simulated Toilet Transfer: Supervision/safety;Ambulation;Regular Toilet;Grab bars   Toileting- Clothing Manipulation and Hygiene: Supervision/safety;Sit to/from stand       Functional mobility during ADLs: Supervision/safety;Rolling walker (2 wheels) General ADL Comments: Pt's daughter will assist with bathing and dressing tasks secondary to precautions.  Handout issued in Spanish with information on positioning, ADLs, back precautions and reviewed by therapist with family and interpreter.  Pt able to ambulate initially with supervision using the RW and progressed to ambulation without use at the same level.  Completed 5 steps with rail on the left side.     Vision Baseline Vision/History: 0 No visual deficits Ability to See in Adequate Light: 0 Adequate Patient Visual Report: No change  from baseline Vision Assessment?: No apparent visual deficits Additional Comments: Vision not tested secondary  to diagnosis of lumbar surgery.     Perception  Not tested   Praxis  Not tested    Pertinent Vitals/Pain Pain Assessment Pain Assessment: 0-10 Pain Score: 8  Pain Location: lower back Pain Descriptors / Indicators: Discomfort Pain Intervention(s): Limited activity within patient's tolerance, Repositioned     Hand Dominance Right   Extremity/Trunk Assessment Upper Extremity Assessment Upper Extremity Assessment: Overall WFL for tasks assessed (grip strength 5/5, other areas not assessed secondary to pain)   Lower Extremity Assessment Lower Extremity Assessment: Overall WFL for tasks assessed   Cervical / Trunk Assessment Cervical / Trunk Assessment: Back Surgery   Communication Communication Communication: Prefers language other than English;Interpreter utilized   Cognition Arousal/Alertness: Awake/alert Behavior During Therapy: WFL for tasks assessed/performed Overall Cognitive Status: Within Functional Limits for tasks assessed                                              Home Living Family/patient expects to be discharged to:: Private residence Living Arrangements: Children (lives with daughter) Available Help at Discharge: Family Type of Home: House Home Access: Stairs to enter Secretary/administrator of Steps: 4-5 Entrance Stairs-Rails: Left Home Layout: One level     Bathroom Shower/Tub: Walk-in shower;Tub/shower unit   Bathroom Toilet: Handicapped height Bathroom Accessibility: Yes   Home Equipment: Agricultural consultant (2 wheels);Shower seat;BSC/3in1   Additional Comments: Family member has equipment he can use per daughter's report      Prior Functioning/Environment Prior Level of Function : Independent/Modified Independent                         AM-PAC OT "6 Clicks" Daily Activity     Outcome Measure Help from another person eating meals?: None Help from another person taking care of personal grooming?: None Help from  another person toileting, which includes using toliet, bedpan, or urinal?: A Little Help from another person bathing (including washing, rinsing, drying)?: A Little Help from another person to put on and taking off regular upper body clothing?: A Little Help from another person to put on and taking off regular lower body clothing?: A Lot 6 Click Score: 19   End of Session Equipment Utilized During Treatment: Rolling walker (2 wheels) Nurse Communication: Mobility status  Activity Tolerance: Patient tolerated treatment well Patient left: in bed;with call bell/phone within reach                   Time: 1610-9604 OT Time Calculation (min): 44 min Charges:  OT General Charges $OT Visit: 1 Visit OT Treatments $Self Care/Home Management : 23-37 mins Perrin Maltese, OTR/L Acute Rehabilitation Services  Office [phone] 07/04/2023

## 2023-07-04 NOTE — Plan of Care (Signed)

## 2023-07-04 NOTE — Progress Notes (Signed)
Neurosurgery Service Progress Note  Subjective: No acute events overnight. Reports complete resolution of pre-op LE radicular pain. Back pain as expected, but is well controlled with medications. Overall, very happy and pleased with his surgical outcomes.    Objective: Vitals:   07/03/23 2026 07/03/23 2302 07/04/23 0420 07/04/23 0733  BP: (!) 149/68 128/66 121/74 117/69  Pulse: (!) 46 (!) 52 (!) 55 61  Resp: 18 18 18 20   Temp: 97.6 F (36.4 C) (!) 97.5 F (36.4 C) 98 F (36.7 C) 98 F (36.7 C)  TempSrc: Oral Oral Oral Oral  SpO2: 100% 97% 96% 91%  Weight:      Height:        Physical Exam: Strength 5/5 x4 and SILTx4, incision c/d/I   Assessment & Plan: 67 y.o. male s/p L2-4 laminectomy / discectomy, recovering well.  -PT/OT -discharge home today   Emilee Hero, PA-C 07/04/23 9:02 AM

## 2023-07-06 ENCOUNTER — Other Ambulatory Visit: Payer: Self-pay

## 2023-09-14 ENCOUNTER — Encounter: Payer: Self-pay | Admitting: Emergency Medicine

## 2023-09-14 ENCOUNTER — Ambulatory Visit: Payer: Self-pay | Admitting: Emergency Medicine

## 2023-09-28 ENCOUNTER — Encounter: Payer: Self-pay | Admitting: Emergency Medicine

## 2023-09-28 ENCOUNTER — Ambulatory Visit (INDEPENDENT_AMBULATORY_CARE_PROVIDER_SITE_OTHER): Payer: Self-pay | Admitting: Emergency Medicine

## 2023-09-28 VITALS — BP 120/74 | HR 66 | Temp 97.8°F | Ht 65.0 in | Wt 163.2 lb

## 2023-09-28 DIAGNOSIS — R252 Cramp and spasm: Secondary | ICD-10-CM | POA: Insufficient documentation

## 2023-09-28 DIAGNOSIS — Z23 Encounter for immunization: Secondary | ICD-10-CM

## 2023-09-28 DIAGNOSIS — E1169 Type 2 diabetes mellitus with other specified complication: Secondary | ICD-10-CM

## 2023-09-28 DIAGNOSIS — E785 Hyperlipidemia, unspecified: Secondary | ICD-10-CM

## 2023-09-28 DIAGNOSIS — Z7984 Long term (current) use of oral hypoglycemic drugs: Secondary | ICD-10-CM

## 2023-09-28 LAB — COMPREHENSIVE METABOLIC PANEL
ALT: 36 U/L (ref 0–53)
AST: 28 U/L (ref 0–37)
Albumin: 4 g/dL (ref 3.5–5.2)
Alkaline Phosphatase: 79 U/L (ref 39–117)
BUN: 11 mg/dL (ref 6–23)
CO2: 28 meq/L (ref 19–32)
Calcium: 9.4 mg/dL (ref 8.4–10.5)
Chloride: 104 meq/L (ref 96–112)
Creatinine, Ser: 0.77 mg/dL (ref 0.40–1.50)
GFR: 92.46 mL/min (ref >60.00)
Glucose, Bld: 136 mg/dL — ABNORMAL HIGH (ref 70–99)
Potassium: 4.3 meq/L (ref 3.5–5.1)
Sodium: 139 meq/L (ref 135–145)
Total Bilirubin: 0.4 mg/dL (ref 0.2–1.2)
Total Protein: 6.5 g/dL (ref 6.0–8.3)

## 2023-09-28 LAB — MICROALBUMIN / CREATININE URINE RATIO
Creatinine,U: 95.3 mg/dL
Microalb Creat Ratio: 15.2 mg/g (ref 0.0–30.0)
Microalb, Ur: 14.5 mg/dL — ABNORMAL HIGH (ref 0.0–1.9)

## 2023-09-28 LAB — POCT GLYCOSYLATED HEMOGLOBIN (HGB A1C): Hemoglobin A1C: 6.8 % — AB (ref 4.0–5.6)

## 2023-09-28 LAB — LIPID PANEL
Cholesterol: 126 mg/dL (ref 0–200)
HDL: 37.4 mg/dL — ABNORMAL LOW (ref >39.00)
LDL Cholesterol: 55 mg/dL (ref 0–99)
NonHDL: 88.84
Total CHOL/HDL Ratio: 3
Triglycerides: 171 mg/dL — ABNORMAL HIGH (ref 0.0–149.0)
VLDL: 34.2 mg/dL (ref 0.0–40.0)

## 2023-09-28 LAB — VITAMIN B12: Vitamin B-12: 395 pg/mL (ref 211–911)

## 2023-09-28 NOTE — Assessment & Plan Note (Signed)
Well-controlled diabetes with hemoglobin A1c of 6.8.  Much better than before. Cardiovascular risks associated with diabetes and dyslipidemia discussed Diet and nutrition discussed Continue metformin 1000 mg twice a day and glipizide 5 mg twice a day Continue atorvastatin 40 mg daily Blood work and urine done today Follow-up in 3 months

## 2023-09-28 NOTE — Progress Notes (Signed)
Justin Stein 67 y.o.   Chief Complaint  Patient presents with   Medical Management of Chronic Issues    f/u appt, patient is having a lot of cramping in his feet and legs, this is happening more at night ,     HISTORY OF PRESENT ILLNESS: This is a 67 y.o. male A1A here for 83-month follow-up of diabetes.  Accompanied by daughter. Doing well.  Compliant with medication.  Presently taking metformin and glipizide. Eating better.  Today concerned about occasional nighttime leg cramping No other complaints or medical concerns today. Wt Readings from Last 3 Encounters:  09/28/23 163 lb 4 oz (74 kg)  07/03/23 160 lb 15 oz (73 kg)  06/10/23 159 lb 8 oz (72.3 kg)   Lab Results  Component Value Date   HGBA1C 8.6 (H) 04/12/2023   BP Readings from Last 3 Encounters:  09/28/23 120/74  07/04/23 117/69  07/01/23 (!) 141/79     HPI   Prior to Admission medications   Medication Sig Start Date End Date Taking? Authorizing Provider  atorvastatin (LIPITOR) 40 MG tablet Take 1 tablet (40 mg total) by mouth daily. 06/10/23 12/07/23 Yes Jonty Morrical, Eilleen Kempf, MD  Camphor-Eucalyptus-Menthol [facility] VAPORUB EX) Apply 1 Application topically daily as needed (pain).   Yes [provider]  cyclobenzaprine (FLEXERIL) 10 MG tablet Take 1 tablet (10 mg total) by mouth 3 (three) times daily as needed for muscle spasms. 07/04/23  Yes Cosentino, Talmadge Chad, PA-C  glipiZIDE (GLUCOTROL) 5 MG tablet Take 1 tablet (5 mg total) by mouth 2 (two) times daily before a meal. 06/10/23 06/04/24 Yes Jurrell Royster, Eilleen Kempf, MD  metFORMIN (GLUCOPHAGE-XR) 500 MG 24 hr tablet Take 2 tablets (1,000 mg total) by mouth 2 (two) times daily with a meal. 06/10/23  Yes Cornelius Marullo, Eilleen Kempf, MD  blood glucose meter kit and supplies KIT Dispense based on patient and insurance preference. Use up to four times daily as directed. (FOR ICD-9 250.00, 250.01). Patient not taking: Reported on 06/10/2023 01/18/21   Rushie Chestnut, PA-C  HYDROcodone-acetaminophen [facility]) 10-325 MG tablet Take 1 tablet by mouth every 4 (four) hours as needed for severe pain. 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) 02/24/2012   Carpal tunnel syndrome of left wrist 02/24/2012   Dyslipidemia associated with type 2 diabetes mellitus (HCC) 01/09/2012   Neuropathy 01/09/2012   Diabetes mellitus type II, uncontrolled 05/28/2009    Past Medical History:  Diagnosis Date   Diabetes mellitus without complication (HCC)     Past Surgical History:  Procedure Laterality Date   LUMBAR LAMINECTOMY/DECOMPRESSION MICRODISCECTOMY Bilateral 07/03/2023   Procedure: BILATERAL LUMBAR TWO-THREE, LUMBAR THREE-FOUR SUBLAMINAR DECOMPRESSION WITH LEFT LUMBAR THREE-FOUR MICRODISKECTOMY;  Surgeon: Donalee Citrin, MD;  Location[facility] OR;  Service: Neurosurgery;  Laterality: Bilateral;  3C   NO PAST SURGERIES      Social History   Socioeconomic History   Marital status: Single    Spouse name: Not on file   Number of children: Not on file   Years of education: Not on file   Highest education level: Not on file  Occupational History   Not on file  Tobacco Use   Smoking status: Former  Smokeless tobacco: Never  Vaping Use   Vaping status: Never Used  Substance and Sexual Activity   Alcohol use: No   Drug use: No   Sexual activity: Not on file  Other Topics Concern   Not on file  Social History Narrative   Not on file   Social Determinants of Health   Financial Resource Strain: Not on file  Food Insecurity: Not on file  Transportation Needs: Not on file  Physical Activity: Not on file  Stress: Not on file  Social Connections: Not  on file  Intimate Partner Violence: Not on file    No family history on file.   Review of Systems  Constitutional: Negative.  Negative for chills and fever.  HENT: Negative.  Negative for congestion and sore throat.   Respiratory:  Negative for cough and shortness of breath.   Cardiovascular: Negative.  Negative for chest pain and palpitations.  Gastrointestinal:  Negative for abdominal pain, diarrhea, nausea and vomiting.  Genitourinary: Negative.  Negative for dysuria and hematuria.  Skin: Negative.  Negative for rash.  Neurological: Negative.  Negative for dizziness and headaches.  All other systems reviewed and are negative.   Vitals:   09/28/23 0949  BP: 120/74  Pulse: 66  Temp: 97.8 F (36.6 C)  SpO2: 98%    Physical Exam Vitals reviewed.  Constitutional:      Appearance: Normal appearance.  HENT:     Head: Normocephalic.     Mouth/Throat:     Mouth: Mucous membranes are moist.     Pharynx: Oropharynx is clear.  Eyes:     Extraocular Movements: Extraocular movements intact.     Conjunctiva/sclera: Conjunctivae normal.     Pupils: Pupils are equal, round, and reactive to light.  Cardiovascular:     Rate and Rhythm: Normal rate and regular rhythm.     Pulses: Normal pulses.     Heart sounds: Normal heart sounds.  Pulmonary:     Effort: Pulmonary effort is normal.     Breath sounds: Normal breath sounds.  Abdominal:     Palpations: Abdomen is soft.     Tenderness: There is no abdominal tenderness.  Musculoskeletal:     Comments: Feet: Warm to touch.  No erythema or ecchymosis.  No skin breakdowns.  Excellent peripheral pulses and excellent capillary refill.  Intact sensation.  No signs of peripheral neuropathy. Lower extremities: No signs of DVT.  No calf tenderness.  Skin:    General: Skin is warm and dry.  Neurological:     General: No focal deficit present.     Mental Status: He is alert and oriented to person, place, and time.  Psychiatric:         Mood and Affect: Mood normal.        Behavior: Behavior normal.    Results for orders placed or performed in visit on 09/28/23 (from the past 24 hour(s))  POCT HgB A1C     Status: Abnormal   Collection Time: 09/28/23 10:10 AM  Result Value Ref Range   Hemoglobin A1C 6.8 (A) 4.0 - 5.6 %   HbA1c POC (<> result, manual entry)     HbA1c, POC (prediabetic range)     HbA1c, POC (controlled diabetic range)       ASSESSMENT & PLAN: A total of 35 minutes was spent with the patient and counseling/coordination of care regarding preparing for this visit, review of most recent office visit notes, review of most recent blood work results including interpretation  of today's hemoglobin A1c, cardiovascular risks associated with diabetes and dyslipidemia, review of all medications, education on nutrition, prognosis, documentation and need for follow-up.  Problem List Items Addressed This Visit       Endocrine   Dyslipidemia associated with type 2 diabetes mellitus (HCC) - Primary    Well-controlled diabetes with hemoglobin A1c of 6.8.  Much better than before. Cardiovascular risks associated with diabetes and dyslipidemia discussed Diet and nutrition discussed Continue metformin 1000 mg twice a day and glipizide 5 mg twice a day Continue atorvastatin 40 mg daily Blood work and urine done today Follow-up in 3 months      Relevant Orders   POCT HgB A1C (Completed)   Vitamin B12   Urine Microalbumin w/creat. ratio   Comprehensive metabolic panel   Lipid panel     Other   Bilateral leg cramps   Other Visit Diagnoses     Need for vaccination       Relevant Orders   Flu Vaccine Trivalent High Dose (Fluad)      Patient Instructions  Diabetes mellitus y nutricin, en adultos Diabetes Mellitus and Nutrition, Adult Si sufre de diabetes, o diabetes mellitus, es muy importante tener hbitos alimenticios saludables debido a que sus niveles de Psychologist, counselling sangre (glucosa) se ven afectados en  gran medida por lo que come y bebe. Comer alimentos saludables en las cantidades correctas, aproximadamente a la misma hora todos los [place], [place] ayudar a: Chief Operating Officer su glucemia. Disminuir el riesgo de sufrir una enfermedad cardaca. Mejorar la presin arterial. Barista o mantener un peso saludable. Qu puede afectar mi plan de alimentacin? Todas las personas que sufren de diabetes son diferentes y cada una tiene necesidades diferentes en cuanto a un plan de alimentacin. El mdico puede recomendarle que trabaje con un nutricionista para elaborar el mejor plan para usted. Su plan de alimentacin puede variar segn factores como: Las caloras que necesita. Los medicamentos que toma. Su peso. Sus niveles de glucemia, presin arterial y colesterol. Su nivel de [place]. Otras afecciones que tenga, como enfermedades cardacas o renales. Cmo me afectan los carbohidratos? Los carbohidratos, o hidratos de carbono, afectan su nivel de glucemia ms que cualquier otro tipo de alimento. La ingesta de carbohidratos aumenta la cantidad de [employer]. Es importante conocer la cantidad de carbohidratos que se pueden ingerir en cada comida sin correr Surveyor, minerals. Esto es Government social research officer. Su nutricionista puede ayudarlo a calcular la cantidad de carbohidratos que debe ingerir en cada comida y en cada refrigerio. Cmo me afecta el alcohol? El alcohol puede provocar una disminucin de la glucemia (hipoglucemia), especialmente si [place] insulina o toma determinados medicamentos por va oral para la diabetes. La hipoglucemia es una afeccin potencialmente mortal. Los sntomas de la hipoglucemia, como somnolencia, mareos y confusin, son similares a los sntomas de haber consumido demasiado alcohol. No beba alcohol si: Su mdico le indica no hacerlo. Est embarazada, puede estar embarazada o est tratando de [place]. Si bebe alcohol: Limite la cantidad que bebe a lo siguiente: De 0  a 1 medida por da para las mujeres. De 0 a 2 medidas por da para los hombres. Sepa cunta cantidad de alcohol hay en las bebidas que toma. En los [address], una medida equivale a una botella de cerveza de 12 oz (355 ml), un vaso de vino de 5 oz (148 ml) o un vaso de una bebida alcohlica de alta graduacin de 1 oz (44 ml). Mantngase hidratado bebiendo  agua, refrescos dietticos o t helado sin azcar. Tenga en cuenta que los refrescos comunes, los jugos y otras bebidas para mezclar pueden contener Product/process development scientist y se deben contar como carbohidratos. Consejos para seguir Social worker las etiquetas de los alimentos Comience por leer el tamao de la porcin en la etiqueta de Informacin nutricional de los alimentos envasados y las bebidas. La cantidad de caloras, carbohidratos, grasas y otros nutrientes detallados en la etiqueta se basan en una porcin del alimento. Muchos alimentos contienen ms de una porcin por envase. Verifique la cantidad total de gramos (g) de carbohidratos totales en una porcin. Verifique la cantidad de gramos de grasas saturadas y grasas trans en una porcin. Escoja alimentos que no contengan estas grasas o que su contenido de estas sea [place]. Verifique la cantidad de miligramos (mg) de sal (sodio) en una porcin. La [employer] de las personas deben limitar la ingesta de sodio total a menos de 2300 mg Google. Siempre consulte la informacin nutricional de los alimentos etiquetados como "con bajo contenido de grasa" o "sin grasa". Estos alimentos pueden tener un mayor contenido de International aid/development worker agregada o carbohidratos refinados, y deben evitarse. Hable con su nutricionista para identificar sus objetivos diarios en cuanto a los nutrientes mencionados en la etiqueta. Al ir de compras Evite comprar alimentos procesados, enlatados o precocidos. Estos alimentos tienden a Counselling psychologist mayor cantidad de [place], sodio y azcar agregada. Compre en la zona exterior de la tienda de  comestibles. Esta es la zona donde se encuentran con mayor frecuencia las frutas y las verduras frescas, los cereales a granel, las carnes frescas y los productos lcteos frescos. Al cocinar Use mtodos de coccin a baja temperatura, como hornear, en lugar de mtodos de coccin a alta temperatura, como frer en abundante aceite. Cocine con aceites saludables, como el aceite de [place], canola o [place]. Evite cocinar con manteca, crema o carnes con alto contenido de grasa. Planificacin de las comidas Coma las comidas y los refrigerios regularmente, preferentemente a la misma hora todos [place]. Evite pasar largos perodos de tiempo sin comer. Consuma alimentos ricos en fibra, como frutas frescas, verduras, frijoles y cereales integrales. Consuma entre 4 y 6 onzas (entre 112 y 168 g) de protenas magras por da, como carnes [place], pollo, pescado, huevos o tofu. Una onza (oz) (28 g) de protena magra equivale a: 1 onza (28 g) de carne, pollo o pescado. 1 huevo.  taza (62 g) de tofu. Coma algunos alimentos por da que contengan grasas saludables, como aguacates, frutos secos, semillas y pescado. Qu alimentos debo comer? Nils Pyle Bayas. Manzanas. Naranjas. Duraznos. Damascos. Ciruelas. Uvas. Mangos. Papayas. Granadas. Kiwi. Cerezas. Verduras Verduras de Marriott, que incluyen [place], [place], col rizada, acelga, hojas de berza, hojas de mostaza y repollo. Remolachas. Coliflor. Brcoli. Zanahorias. Judas verdes. Tomates. Pimientos. Cebollas. Pepinos. Coles de Bruselas. Granos Granos integrales, como panes, galletas, tortillas, cereales y pastas de salvado o integrales. Avena sin azcar. Quinua. Arroz integral o salvaje. Carnes y otras protenas Frutos de mar. Carne de ave sin piel. Cortes magros de ave y carne de res. Tofu. Frutos secos. Semillas. Lcteos Productos lcteos sin grasa o con bajo contenido de [place], [place], yogur y [place]. Es posible que los productos detallados arriba no  constituyan una lista completa de los alimentos y las bebidas que puede tomar. Consulte a un nutricionista para obtener ms informacin. Qu alimentos debo evitar? Nils Pyle Frutas enlatadas al almbar. Verduras Verduras enlatadas. Verduras congeladas con mantequilla o salsa de  crema. Granos Productos elaborados con [place] y [place], como panes, pastas, bocadillos y cereales. Evite todos los alimentos procesados. Carnes y [address] de carne con alto contenido de Holiday representative. Carne de ave con piel. Carnes empanizadas o fritas. Carne procesada. Evite las grasas saturadas. Lcteos Yogur, queso o [employer] Health. Bebidas Bebidas azucaradas, como gaseosas o t helado. Es posible que los productos que se enumeran ms [place] no constituyan una lista completa de los alimentos y las bebidas que Personnel officer. Consulte a un nutricionista para obtener ms informacin. Preguntas para hacerle al mdico Debo consultar con un especialista certificado en atencin y educacin sobre la diabetes? Es necesario que me rena con un nutricionista? A qu nmero puedo llamar si tengo preguntas? Cules son los mejores momentos para controlar la glucemia? Dnde encontrar ms informacin: American Diabetes Association (Asociacin Estadounidense de la Diabetes): diabetes.org Academy of Nutrition and Dietetics (Academia de Nutricin y Pension scheme manager): eatright.Dana Corporation of Diabetes and Digestive and Kidney Diseases [employer] Company de la Diabetes y las Enfermedades Digestivas y Renales): StageSync.si Association of Diabetes Care & Education Specialists (Asociacin de Especialistas en Atencin y Educacin sobre la Diabetes): diabeteseducator.org Resumen Es importante tener hbitos alimenticios saludables debido a que sus niveles de Psychologist, counselling sangre (glucosa) se ven afectados en gran medida por lo que come y bebe. Es importante consumir alcohol con prudencia. Un plan de comidas  saludable lo ayudar a controlar la glucosa en sangre y a reducir el riesgo de enfermedades cardacas. El mdico puede recomendarle que trabaje con un nutricionista para elaborar el mejor plan para usted. Esta informacin no tiene Theme park manager el consejo del mdico. Asegrese de hacerle al mdico cualquier pregunta que tenga. Document Revised: 08/15/2020 Document Reviewed: 08/15/2020 Elsevier Patient Education  2024 Elsevier Inc.     Edwina Barth, MD [facility]

## 2023-09-28 NOTE — Patient Instructions (Signed)
Diabetes mellitus y nutricin, en adultos Diabetes Mellitus and Nutrition, Adult Si sufre de diabetes, o diabetes mellitus, es muy importante tener hbitos alimenticios saludables debido a que sus niveles de azcar en la sangre (glucosa) se ven afectados en gran medida por lo que come y bebe. Comer alimentos saludables en las cantidades correctas, aproximadamente a la misma hora todos los das, lo ayudar a: Controlar su glucemia. Disminuir el riesgo de sufrir una enfermedad cardaca. Mejorar la presin arterial. Alcanzar o mantener un peso saludable. Qu puede afectar mi plan de alimentacin? Todas las personas que sufren de diabetes son diferentes y cada una tiene necesidades diferentes en cuanto a un plan de alimentacin. El mdico puede recomendarle que trabaje con un nutricionista para elaborar el mejor plan para usted. Su plan de alimentacin puede variar segn factores como: Las caloras que necesita. Los medicamentos que toma. Su peso. Sus niveles de glucemia, presin arterial y colesterol. Su nivel de actividad. Otras afecciones que tenga, como enfermedades cardacas o renales. Cmo me afectan los carbohidratos? Los carbohidratos, o hidratos de carbono, afectan su nivel de glucemia ms que cualquier otro tipo de alimento. La ingesta de carbohidratos aumenta la cantidad de glucosa en la sangre. Es importante conocer la cantidad de carbohidratos que se pueden ingerir en cada comida sin correr ningn riesgo. Esto es diferente en cada persona. Su nutricionista puede ayudarlo a calcular la cantidad de carbohidratos que debe ingerir en cada comida y en cada refrigerio. Cmo me afecta el alcohol? El alcohol puede provocar una disminucin de la glucemia (hipoglucemia), especialmente si usa insulina o toma determinados medicamentos por va oral para la diabetes. La hipoglucemia es una afeccin potencialmente mortal. Los sntomas de la hipoglucemia, como somnolencia, mareos y confusin, son  similares a los sntomas de haber consumido demasiado alcohol. No beba alcohol si: Su mdico le indica no hacerlo. Est embarazada, puede estar embarazada o est tratando de quedar embarazada. Si bebe alcohol: Limite la cantidad que bebe a lo siguiente: De 0 a 1 medida por da para las mujeres. De 0 a 2 medidas por da para los hombres. Sepa cunta cantidad de alcohol hay en las bebidas que toma. En los [place], una medida equivale a una botella de cerveza de 12 oz (355 ml), un vaso de vino de 5 oz (148 ml) o un vaso de una bebida alcohlica de alta graduacin de 1 oz (44 ml). Mantngase hidratado bebiendo agua, refrescos dietticos o t helado sin azcar. Tenga en cuenta que los refrescos comunes, los jugos y otras bebidas para mezclar pueden contener mucha azcar y se deben contar como carbohidratos. Consejos para seguir este plan  Leer las etiquetas de los alimentos Comience por leer el tamao de la porcin en la etiqueta de Informacin nutricional de los alimentos envasados y las bebidas. La cantidad de caloras, carbohidratos, grasas y otros nutrientes detallados en la etiqueta se basan en una porcin del alimento. Muchos alimentos contienen ms de una porcin por envase. Verifique la cantidad total de gramos (g) de carbohidratos totales en una porcin. Verifique la cantidad de gramos de grasas saturadas y grasas trans en una porcin. Escoja alimentos que no contengan estas grasas o que su contenido de estas sea bajo. Verifique la cantidad de miligramos (mg) de sal (sodio) en una porcin. La mayora de las personas deben limitar la ingesta de sodio total a menos de 2300 mg por da. Siempre consulte la informacin nutricional de los alimentos etiquetados como "con bajo contenido de grasa" o "sin grasa".   Estos alimentos pueden tener un mayor contenido de azcar agregada o carbohidratos refinados, y deben evitarse. Hable con su nutricionista para identificar sus objetivos diarios en  cuanto a los nutrientes mencionados en la etiqueta. Al ir de compras Evite comprar alimentos procesados, enlatados o precocidos. Estos alimentos tienden a tener una mayor cantidad de grasa, sodio y azcar agregada. Compre en la zona exterior de la tienda de comestibles. Esta es la zona donde se encuentran con mayor frecuencia las frutas y las verduras frescas, los cereales a granel, las carnes frescas y los productos lcteos frescos. Al cocinar Use mtodos de coccin a baja temperatura, como hornear, en lugar de mtodos de coccin a alta temperatura, como frer en abundante aceite. Cocine con aceites saludables, como el aceite de oliva, canola o girasol. Evite cocinar con manteca, crema o carnes con alto contenido de grasa. Planificacin de las comidas Coma las comidas y los refrigerios regularmente, preferentemente a la misma hora todos los das. Evite pasar largos perodos de tiempo sin comer. Consuma alimentos ricos en fibra, como frutas frescas, verduras, frijoles y cereales integrales. Consuma entre 4 y 6 onzas (entre 112 y 168 g) de protenas magras por da, como carnes magras, pollo, pescado, huevos o tofu. Una onza (oz) (28 g) de protena magra equivale a: 1 onza (28 g) de carne, pollo o pescado. 1 huevo.  taza (62 g) de tofu. Coma algunos alimentos por da que contengan grasas saludables, como aguacates, frutos secos, semillas y pescado. Qu alimentos debo comer? Frutas Bayas. Manzanas. Naranjas. Duraznos. Damascos. Ciruelas. Uvas. Mangos. Papayas. Granadas. Kiwi. Cerezas. Verduras Verduras de hoja verde, que incluyen lechuga, espinaca, col rizada, acelga, hojas de berza, hojas de mostaza y repollo. Remolachas. Coliflor. Brcoli. Zanahorias. Judas verdes. Tomates. Pimientos. Cebollas. Pepinos. Coles de Bruselas. Granos Granos integrales, como panes, galletas, tortillas, cereales y pastas de salvado o integrales. Avena sin azcar. Quinua. Arroz integral o salvaje. Carnes y otras  protenas Frutos de mar. Carne de ave sin piel. Cortes magros de ave y carne de res. Tofu. Frutos secos. Semillas. Lcteos Productos lcteos sin grasa o con bajo contenido de grasa, como leche, yogur y queso. Es posible que los productos detallados arriba no constituyan una lista completa de los alimentos y las bebidas que puede tomar. Consulte a un nutricionista para obtener ms informacin. Qu alimentos debo evitar? Frutas Frutas enlatadas al almbar. Verduras Verduras enlatadas. Verduras congeladas con mantequilla o salsa de crema. Granos Productos elaborados con harina y harina blanca refinada, como panes, pastas, bocadillos y cereales. Evite todos los alimentos procesados. Carnes y otras protenas Cortes de carne con alto contenido de grasa. Carne de ave con piel. Carnes empanizadas o fritas. Carne procesada. Evite las grasas saturadas. Lcteos Yogur, queso o leche enteros. Bebidas Bebidas azucaradas, como gaseosas o t helado. Es posible que los productos que se enumeran ms arriba no constituyan una lista completa de los alimentos y las bebidas que debe evitar. Consulte a un nutricionista para obtener ms informacin. Preguntas para hacerle al mdico Debo consultar con un especialista certificado en atencin y educacin sobre la diabetes? Es necesario que me rena con un nutricionista? A qu nmero puedo llamar si tengo preguntas? Cules son los mejores momentos para controlar la glucemia? Dnde encontrar ms informacin: American Diabetes Association (Asociacin Estadounidense de la Diabetes): diabetes.org Academy of Nutrition and Dietetics (Academia de Nutricin y Diettica): eatright.org National Institute of Diabetes and Digestive and Kidney Diseases (Instituto Nacional de la Diabetes y las Enfermedades Digestivas y Renales): niddk.nih.gov Association of Diabetes   Care & Education Specialists (Asociacin de Especialistas en Atencin y Educacin sobre la Diabetes):  diabeteseducator.org Resumen Es importante tener hbitos alimenticios saludables debido a que sus niveles de azcar en la sangre (glucosa) se ven afectados en gran medida por lo que come y bebe. Es importante consumir alcohol con prudencia. Un plan de comidas saludable lo ayudar a controlar la glucosa en sangre y a reducir el riesgo de enfermedades cardacas. El mdico puede recomendarle que trabaje con un nutricionista para elaborar el mejor plan para usted. Esta informacin no tiene como fin reemplazar el consejo del mdico. Asegrese de hacerle al mdico cualquier pregunta que tenga. Document Revised: 08/15/2020 Document Reviewed: 08/15/2020 Elsevier Patient Education  2024 Elsevier Inc.  

## 2023-09-29 ENCOUNTER — Other Ambulatory Visit: Payer: Self-pay

## 2023-09-30 ENCOUNTER — Other Ambulatory Visit: Payer: Self-pay

## 2023-09-30 MED ORDER — GABAPENTIN 300 MG PO CAPS
300.0000 mg | ORAL_CAPSULE | Freq: Three times a day (TID) | ORAL | 1 refills | Status: DC
  Filled 2023-09-30: qty 45, 15d supply, fill #0
  Filled 2023-11-03: qty 45, 15d supply, fill #1

## 2023-10-13 ENCOUNTER — Other Ambulatory Visit: Payer: Self-pay

## 2023-10-13 MED ORDER — METHOCARBAMOL 750 MG PO TABS
750.0000 mg | ORAL_TABLET | Freq: Four times a day (QID) | ORAL | 2 refills | Status: DC
  Filled 2023-10-13: qty 60, 15d supply, fill #0

## 2023-10-22 ENCOUNTER — Other Ambulatory Visit: Payer: Self-pay

## 2023-10-27 ENCOUNTER — Ambulatory Visit (INDEPENDENT_AMBULATORY_CARE_PROVIDER_SITE_OTHER): Payer: Self-pay | Admitting: Emergency Medicine

## 2023-10-27 ENCOUNTER — Encounter: Payer: Self-pay | Admitting: Emergency Medicine

## 2023-10-27 ENCOUNTER — Other Ambulatory Visit: Payer: Self-pay

## 2023-10-27 VITALS — BP 118/70 | HR 65 | Temp 97.9°F | Ht 65.0 in | Wt 168.0 lb

## 2023-10-27 DIAGNOSIS — E1169 Type 2 diabetes mellitus with other specified complication: Secondary | ICD-10-CM

## 2023-10-27 DIAGNOSIS — M159 Polyosteoarthritis, unspecified: Secondary | ICD-10-CM

## 2023-10-27 DIAGNOSIS — E785 Hyperlipidemia, unspecified: Secondary | ICD-10-CM

## 2023-10-27 MED ORDER — MELOXICAM 15 MG PO TABS
15.0000 mg | ORAL_TABLET | Freq: Every day | ORAL | 0 refills | Status: DC
Start: 2023-10-27 — End: 2023-11-05
  Filled 2023-10-27: qty 14, 14d supply, fill #0

## 2023-10-27 NOTE — Assessment & Plan Note (Signed)
Lab Results  Component Value Date   HGBA1C 6.8 (A) 09/28/2023  Well-controlled diabetes Has been on atorvastatin for a while.  May be contributing to generalized muscle achiness.  Advised to stop atorvastatin for 2 weeks and monitor symptoms. Diet and nutrition discussed Follow-up in 4 weeks

## 2023-10-27 NOTE — Patient Instructions (Signed)
Stop atorvastatin for 2 weeks Take meloxicam 15 mg for 2 weeks Continue other medications  Artrosis Osteoarthritis  La artrosis es un tipo de artritis. Esta afeccin produce dolor o enfermedad en las articulaciones. La artrosis afecta al tejido que cubre los extremos de los huesos en las articulaciones (cartlago). El cartlago acta como amortiguador [employer] y los ayuda a moverse con suavidad. La artrosis se presenta cuando el cartlago de las articulaciones se gasta. A veces, la artrosis se denomina artritis "por uso y desgaste". La artrosis es la forma ms frecuente de artritis. A menudo, afecta a las [employer]. Es una afeccin que empeora con el [place]. Las articulaciones afectadas con mayor frecuencia por esta afeccin se encuentran en los dedos de [employer], los dedos de [employer], las caderas, las rodillas y la columna vertebral, incluyendo el cuello y la parte inferior de la espalda. Cules son las causas? Esta afeccin es causada por el desgaste del cartlago que cubre los extremos de [place]. Qu incrementa el riesgo? Los siguientes factores pueden hacer que sea ms propenso a Aeronautical engineer afeccin: Ser mayor de 50 aos. Obesidad. Uso excesivo de Nurse, learning disability. Lesin pasada de Risk analyst. Ciruga pasada en una articulacin. Antecedentes familiares de artrosis. Cules son los signos o sntomas? Los principales sntomas de esta enfermedad son dolor, hinchazn y Geophysical data processor. Otros sntomas pueden incluir: Agrandamiento de Nurse, learning disability. Aumento del dolor y dao adicional causado por pequeos trozos de [employer] o TEFL teacher que se desprenden y flotan dentro de Nurse, learning disability. Formacin de pequeos depsitos de hueso (osteofitos) en los extremos de Nurse, learning disability. Una sensacin de chirrido o raspado dentro de la articulacin al moverla. Sonidos de chasquido o crujido al [employer]. Dificultad para caminar o hacer ejercicio. Incapacidad  para agarrar objetos, girar la mano o controlar los movimientos de las manos y los dedos. Cmo se diagnostica? Esta afeccin se puede diagnosticar en funcin de lo siguiente: Sus antecedentes mdicos. Un examen fsico. Los sntomas. Radiografas de las articulaciones afectadas. Anlisis de sangre para descartar otros tipos de artritis. Cmo se trata? No hay cura para esta enfermedad, pero el tratamiento puede ayudar a Human resources officer y Scientist, clinical (histocompatibility and immunogenetics) el funcionamiento de Nurse, learning disability. El tratamiento puede incluir una combinacin de terapias, [place] las siguientes: Tcnicas de alivio del dolor, como: Aplicacin de calor y fro en la articulacin. Masajes. Una forma de psicoterapia llamada terapia cognitivo conductual (TCC). Esta terapia le ayuda a Consulting civil engineer y a Education officer, environmental un seguimiento de los cambios que hace. Analgsicos y antiinflamatorios. Los medicamentos pueden tomarse por boca o [employer]. Incluyen los siguientes: Antiinflamatorios no esteroideos (AINE), como el ibuprofeno. Medicamentos recetados. Antiinflamatorios fuertes (corticoesteroides). Ciertos suplementos nutricionales. Un programa de ejercicios recomendado. Puede trabajar con un fisioterapeuta. Dispositivos de [place], como un dispositivo ortopdico, una frula, un guante especial o un bastn. Un plan de control del peso. [place], como: Carolin Sicks. Se hace para volver a posicionar los [employer] y Engineer, materials o para Oceanographer los trozos sueltos de hueso y TEFL teacher. Ciruga de reemplazo articular. Es posible que necesite esta ciruga si tiene una artrosis Brenas. Siga estas indicaciones en su casa: Actividad Descanse las articulaciones afectadas como se lo haya indicado el mdico. Haga actividad fsica como se lo haya indicado el mdico. El mdico puede recomendar tipos especficos de ejercicios, por ejemplo: Ejercicios de fortalecimiento. Se realizan para fortalecer los msculos que sostienen las  articulaciones afectadas por la artritis. Ejercicios aerbicos. Son ejercicios, como  caminar a paso ligero o hacer gimnasia [place] acutica, que aumentan la frecuencia cardaca. Actividades de amplitud de movimientos. Estos ayudan a que las articulaciones se muevan con ms facilidad. Ejercicios de equilibrio y [place]. Control del dolor, la rigidez y la hinchazn     Si se lo indican, aplique calor en la zona afectada con la frecuencia que le haya dicho el mdico. Use la fuente de calor que el mdico le recomiende, como una compresa de calor hmedo o una almohadilla trmica. Si tiene un dispositivo de [employer] se puede quitar, [employer] segn lo indicado por su mdico. Coloque una toalla entre la piel y la fuente de Airline pilot. Si el mdico le indica que no se quite el dispositivo de USAA se Tax inspector, coloque una toalla entre el dispositivo de [place] y la fuente de Airline pilot. Aplique calor durante 20 a 30 minutos. Si se lo indican, aplique hielo en la zona afectada. Si tiene un dispositivo de ayuda que se puede quitar, [employer] segn lo indicado por su mdico. Ponga el hielo en una bolsa plstica. Coloque una toalla entre la piel y Copy. Si el mdico le indica que no se quite el dispositivo de USAA se aplica hielo, coloque una toalla entre el dispositivo de [place] y la bolsa de hielo. Aplique el hielo durante 20 minutos, 2 o 3 veces por da. Si la piel se le pone de color rojo brillante, retire el hielo o Company secretary de inmediato para evitar daos en la piel. El [place] de dao es mayor si no puede sentir dolor, Airline pilot o fro. Mueva los dedos de las manos o de los pies con frecuencia para reducir la rigidez y la hinchazn. Cuando est sentado o acostado, levante (eleve) la zona afectada por encima del nivel del corazn. Indicaciones generales Use los medicamentos de venta libre y los recetados solamente como se lo haya indicado el mdico. Mantenga un peso saludable. Siga las  instrucciones del mdico con respecto al control del [place]. No consuma ningn producto que contenga nicotina o tabaco. Estos productos incluyen cigarrillos, tabaco para Theatre manager y aparatos de vapeo, como los Administrator, Civil Service. Si necesita ayuda para dejar de consumir estos productos, consulte al [employer]. Use los dispositivos de ayuda como se lo haya indicado el mdico. Dnde buscar ms informacin [employer] of Arthritis and Musculoskeletal and Skin Diseases (Instituto [employer] de Artritis y [place] Musculoesquelticas y Arboriculturist): niams.[email or web address].myers.net/ [employer] on Aging Lawyer sobre el Envejecimiento): BaseRingTones.pl [employer] of Rheumatology (Instituto Estadounidense de Reumatologa): rheumatology.org Comunquese con un mdico si: Tiene enrojecimiento, hinchazn o sensacin de calor que empeora en una articulacin. Tiene fiebre y siente dolor en la articulacin o el msculo. Presenta una erupcin cutnea. Tiene dificultad para [employer] cotidianas. Siente dolor que empeora y no se alivia con los analgsicos. Esta informacin no tiene Theme park manager el consejo del mdico. Asegrese de hacerle al mdico cualquier pregunta que tenga. Document Revised: 09/09/2022 Document Reviewed: 09/09/2022 Elsevier Patient Education  2024 [employer].

## 2023-10-27 NOTE — Assessment & Plan Note (Signed)
History of osteoarthritis.  Mild chronic deformities seen in both hands.  Recommend meloxicam 15 mg daily for 2 weeks Monitor symptoms for improvement. Advised to stay well-hydrated and avoid heavy lifting, pushing or pulling.

## 2023-10-27 NOTE — Progress Notes (Signed)
Justin Stein 67 y.o.   Chief Complaint  Patient presents with   Pain    Both arms, elbows, shoulders has been causing pain for a week also having headaches and neck pain. Pt states e is also having pain around both eyes. During the night right arm swells and sweats.     HISTORY OF PRESENT ILLNESS: This is a 67 y.o. male complaining of pain and cramps to upper extremities, lower extremities, upper back and neck on and off for the past several weeks. History of diabetes.  History of osteoarthritis.  History of diabetes and dyslipidemia.  Has been on atorvastatin for couple years. Status post lumbar surgery last July. Most recent lumbar spine MRI as follows: IMPRESSION: 1. Unchanged multilevel lumbar spondylosis, worst at L3-4, where there is severe spinal canal stenosis. 2. Left eccentric disc bulge at L2-3 results in displacement of both the traversing left L3 nerve root and the exited left L2 nerve root. 3. Right eccentric disc bulge at L4-5 results in displacement of the traversing right L5 nerve root in the subarticular zone. 4. Marked distention of the bladder with associated mild hydroureteronephrosis.     Electronically Signed   By: Orvan Falconer M.D.   On: 07/01/2023 13:18 HPI   Prior to Admission medications   Medication Sig Start Date End Date Taking? Authorizing Provider  atorvastatin (LIPITOR) 40 MG tablet Take 1 tablet (40 mg total) by mouth daily. 06/10/23 12/07/23  Georgina Quint, MD  blood glucose meter kit and supplies KIT Dispense based on patient and insurance preference. Use up to four times daily as directed. (FOR ICD-9 250.00, 250.01). Patient not taking: Reported on 06/10/2023 01/18/21   Rushie Chestnut, PA-C  Camphor-Eucalyptus-Menthol [facility] VAPORUB EX) Apply 1 Application topically daily as needed (pain).    [provider]  cyclobenzaprine (FLEXERIL) 10 MG tablet Take 1 tablet (10 mg total) by mouth 3 (three) times daily as  needed for muscle spasms. 07/04/23   Cosentino, Talmadge Chad, PA-C  gabapentin (NEURONTIN) 300 MG capsule Take 1 capsule (300 mg total) by mouth 3 (three) times daily. 09/30/23     glipiZIDE (GLUCOTROL) 5 MG tablet Take 1 tablet (5 mg total) by mouth 2 (two) times daily before a meal. 06/10/23 06/04/24  Zong Mcquarrie, Eilleen Kempf, MD  HYDROcodone-acetaminophen [facility]) 10-325 MG tablet Take 1 tablet by mouth every 4 (four) hours as needed for severe pain. Patient not taking: Reported on 09/28/2023 07/04/23   Iran Sizer, PA-C  metFORMIN (GLUCOPHAGE-XR) 500 MG 24 hr tablet Take 2 tablets (1,000 mg total) by mouth 2 (two) times daily with a meal. 06/10/23   Lyrah Bradt, Eilleen Kempf, MD  methocarbamol (ROBAXIN) 750 MG tablet Take 1 tablet (750 mg total) by mouth every 6 (six) hours. 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) 02/24/2012   Carpal tunnel syndrome of left wrist 02/24/2012   Dyslipidemia associated with type 2 diabetes mellitus (HCC) 01/09/2012   Neuropathy 01/09/2012   Diabetes mellitus type II, uncontrolled 05/28/2009    Past Medical History:  Diagnosis Date   Diabetes mellitus without complication [facility])     Past Surgical History:  Procedure Laterality Date   LUMBAR LAMINECTOMY/DECOMPRESSION MICRODISCECTOMY Bilateral 07/03/2023   Procedure:  BILATERAL LUMBAR TWO-THREE, LUMBAR THREE-FOUR SUBLAMINAR DECOMPRESSION WITH LEFT LUMBAR THREE-FOUR MICRODISKECTOMY;  Surgeon: Donalee Citrin, MD;  Location: [facility] OR;  Service: Neurosurgery;  Laterality: Bilateral;  3C   NO PAST SURGERIES      Social History   Socioeconomic History   Marital status: Single    Spouse name: Not on file   Number of children: Not on file   Years  of education: Not on file   Highest education level: Not on file  Occupational History   Not on file  Tobacco Use   Smoking status: Former   Smokeless tobacco: Never  Vaping Use   Vaping status: Never Used  Substance and Sexual Activity   Alcohol use: No   Drug use: No   Sexual activity: Not on file  Other Topics Concern   Not on file  Social History Narrative   Not on file   Social Determinants of Health   Financial Resource Strain: Not on file  Food Insecurity: Not on file  Transportation Needs: Not on file  Physical Activity: Not on file  Stress: Not on file  Social Connections: Not on file  Intimate Partner Violence: Not on file    No family history on file.   Review of Systems  Constitutional: Negative.  Negative for chills and fever.  HENT: Negative.  Negative for congestion and sore throat.   Respiratory: Negative.  Negative for cough and shortness of breath.   Cardiovascular: Negative.  Negative for chest pain and palpitations.  Gastrointestinal:  Negative for abdominal pain, diarrhea, nausea and vomiting.  Genitourinary: Negative.  Negative for dysuria and hematuria.  Musculoskeletal:  Positive for back pain, joint pain and neck pain.  Skin: Negative.  Negative for rash.  Neurological: Negative.  Negative for dizziness and headaches.  All other systems reviewed and are negative.   Vitals:   10/27/23 1000  BP: 118/70  Pulse: 65  Temp: 97.9 F (36.6 C)  SpO2: 95%    Physical Exam Vitals reviewed.  Constitutional:      Appearance: Normal appearance.  HENT:     Head: Normocephalic.     Mouth/Throat:     Mouth: Mucous membranes are moist.     Pharynx: Oropharynx is clear.  Eyes:     Extraocular Movements: Extraocular movements intact.     Pupils: Pupils are equal, round, and reactive to light.  Cardiovascular:     Rate and Rhythm: Normal rate and regular rhythm.     Pulses: Normal pulses.     Heart sounds: Normal heart sounds.  Pulmonary:      Effort: Pulmonary effort is normal.     Breath sounds: Normal breath sounds.  Abdominal:     Palpations: Abdomen is soft.     Tenderness: There is no abdominal tenderness.  Musculoskeletal:        General: No swelling or signs of injury.     Cervical back: No tenderness.     Right lower leg: No edema.     Left lower leg: No edema.     Comments: Mild arthritic changes noted in both hands  Lymphadenopathy:     Cervical: No cervical adenopathy.  Skin:    General: Skin is warm and dry.     Capillary Refill: Capillary refill takes less than 2 seconds.  Neurological:     General: No focal deficit present.     Mental Status: He is alert and oriented to person, place, and time.  Psychiatric:  Mood and Affect: Mood normal.        Behavior: Behavior normal.      ASSESSMENT & PLAN: A total of 33 minutes was spent with the patient and counseling/coordination of care regarding preparing for this visit, review of most recent office visit notes, review of chronic medical conditions under management, review of all medications, diagnosis of osteoarthritis and management, pain management, prognosis, documentation, and need for follow-up in 4 weeks.  Problem List Items Addressed This Visit       Endocrine   Dyslipidemia associated with type 2 diabetes mellitus (HCC)    Lab Results  Component Value Date   HGBA1C 6.8 (A) 09/28/2023  Well-controlled diabetes Has been on atorvastatin for a while.  May be contributing to generalized muscle achiness.  Advised to stop atorvastatin for 2 weeks and monitor symptoms. Diet and nutrition discussed Follow-up in 4 weeks       Relevant Orders   Ambulatory referral to Ophthalmology     Musculoskeletal and Integument   Generalized osteoarthritis of multiple sites - Primary    History of osteoarthritis.  Mild chronic deformities seen in both hands.  Recommend meloxicam 15 mg daily for 2 weeks Monitor symptoms for improvement. Advised to stay  well-hydrated and avoid heavy lifting, pushing or pulling.      Relevant Medications   meloxicam (MOBIC) 15 MG tablet   Patient Instructions  Stop atorvastatin for 2 weeks Take meloxicam 15 mg for 2 weeks Continue other medications  Artrosis Osteoarthritis  La artrosis es un tipo de artritis. Esta afeccin produce dolor o enfermedad en las articulaciones. La artrosis afecta al tejido que cubre los extremos de los huesos en las articulaciones (cartlago). El cartlago acta como amortiguador [employer] y los ayuda a moverse con suavidad. La artrosis se presenta cuando el cartlago de las articulaciones se gasta. A veces, la artrosis se denomina artritis "por uso y desgaste". La artrosis es la forma ms frecuente de artritis. A menudo, afecta a las [employer] International. Es una afeccin que empeora con el [place]. Las articulaciones afectadas con mayor frecuencia por esta afeccin se encuentran en los dedos de [employer], los dedos de [employer]ack, las caderas, las rodillas y la columna vertebral, incluyendo el cuello y la parte inferior de la espalda. Cules son las causas? Esta afeccin es causada por el desgaste del cartlago que cubre los extremos de [place]. Qu incrementa el riesgo? Los siguientes factores pueden hacer que sea ms propenso a Aeronautical engineer afeccin: Ser mayor de 50 aos. Obesidad. Uso excesivo de Nurse, learning disability. Lesin pasada de Risk analyst. Ciruga pasada en una articulacin. Antecedentes familiares de artrosis. Cules son los signos o sntomas? Los principales sntomas de esta enfermedad son dolor, hinchazn y Geophysical data processor. Otros sntomas pueden incluir: Agrandamiento de Nurse, learning disability. Aumento del dolor y dao adicional causado por pequeos trozos de [employer] o TEFL teacher que se desprenden y flotan dentro de Nurse, learning disability. Formacin de pequeos depsitos de hueso (osteofitos) en los extremos de Nurse, learning disability. Una sensacin de chirrido o  raspado dentro de la articulacin al moverla. Sonidos de chasquido o crujido al [employer]. Dificultad para caminar o hacer ejercicio. Incapacidad para agarrar objetos, girar la mano o controlar los movimientos de las manos y los dedos. Cmo se diagnostica? Esta afeccin se puede diagnosticar en funcin de lo siguiente: Sus antecedentes mdicos. Un examen fsico. Los sntomas. Radiografas de las articulaciones afectadas. Anlisis de sangre para descartar otros tipos de artritis. Cmo se trata?  No hay cura para esta enfermedad, pero el tratamiento puede ayudar a Human resources officer y Scientist, clinical (histocompatibility and immunogenetics) el funcionamiento de Nurse, learning disability. El tratamiento puede incluir una combinacin de terapias, [place] las siguientes: Tcnicas de alivio del dolor, como: Aplicacin de calor y fro en la articulacin. Masajes. Una forma de psicoterapia llamada terapia cognitivo conductual (TCC). Esta terapia le ayuda a Consulting civil engineer y a Education officer, environmental un seguimiento de los cambios que hace. Analgsicos y antiinflamatorios. Los medicamentos pueden tomarse por boca o [employer]. Incluyen los siguientes: Antiinflamatorios no esteroideos (AINE), como el ibuprofeno. Medicamentos recetados. Antiinflamatorios fuertes (corticoesteroides). Ciertos suplementos nutricionales. Un programa de ejercicios recomendado. Puede trabajar con un fisioterapeuta. Dispositivos de [place], como un dispositivo ortopdico, una frula, un guante especial o un bastn. Un plan de control del peso. [place], como: Carolin Sicks. Se hace para volver a posicionar los [employer]e y Engineer, materials o para Oceanographer los trozos sueltos de hueso y TEFL teacher. Ciruga de reemplazo articular. Es posible que necesite esta ciruga si tiene una artrosis [place]. Siga estas indicaciones en su casa: Actividad Descanse las articulaciones afectadas como se lo haya indicado el mdico. Haga actividad fsica como se lo haya indicado el mdico. El mdico puede  recomendar tipos especficos de ejercicios, por ejemplo: Ejercicios de fortalecimiento. Se realizan para fortalecer los msculos que sostienen las articulaciones afectadas por la artritis. Ejercicios aerbicos. Son ejercicios, como caminar a paso ligero o hacer gimnasia [place] acutica, que aumentan la frecuencia cardaca. Actividades de amplitud de movimientos. Estos ayudan a que las articulaciones se muevan con ms facilidad. Ejercicios de equilibrio y [place]. Control del dolor, la rigidez y la hinchazn     Si se lo indican, aplique calor en la zona afectada con la frecuencia que le haya dicho el mdico. Use la fuente de calor que el mdico le recomiende, como una compresa de calor hmedo o una almohadilla trmica. Si tiene un dispositivo de [employer] se puede quitar, [employer] International segn lo indicado por su mdico. Coloque una toalla entre la piel y la fuente de Airline pilot. Si el mdico le indica que no se quite el dispositivo de USAA se Tax inspector, coloque una toalla entre el dispositivo de [place] y la fuente de Airline pilot. Aplique calor durante 20 a 30 minutos. Si se lo indican, aplique hielo en la zona afectada. Si tiene un dispositivo de ayuda que se puede quitar, [employer] International segn lo indicado por su mdico. Ponga el hielo en una bolsa plstica. Coloque una toalla entre la piel y Copy. Si el mdico le indica que no se quite el dispositivo de USAA se aplica hielo, coloque una toalla entre el dispositivo de [place] y la bolsa de hielo. Aplique el hielo durante 20 minutos, 2 o 3 veces por da. Si la piel se le pone de color rojo brillante, retire el hielo o Company secretary de inmediato para evitar daos en la piel. El [place] de dao es mayor si no puede sentir dolor, Airline pilot o fro. Mueva los dedos de las manos o de los pies con frecuencia para reducir la rigidez y la hinchazn. Cuando est sentado o acostado, levante (eleve) la zona afectada por encima del nivel del corazn. Indicaciones  generales Use los medicamentos de venta libre y los recetados solamente como se lo haya indicado el mdico. Mantenga un peso saludable. Siga las instrucciones del mdico con respecto al control del [place]. No consuma ningn producto que contenga nicotina o tabaco. Estos productos incluyen cigarrillos, tabaco para Theatre manager  y aparatos de vapeo, como los Administrator, Civil Service. Si necesita ayuda para dejar de consumir estos productos, consulte al American Express. Use los dispositivos de ayuda como se lo haya indicado el mdico. Dnde buscar ms informacin [employer] Mills of Arthritis and Musculoskeletal and Skin Diseases (Instituto [employer] de Artritis y [place] Musculoesquelticas y Arboriculturist): niams.http://www.myers.net/ [employer] Mills on Aging Lawyer sobre el Envejecimiento): BaseRingTones.pl Celanese Corporation of Rheumatology (Instituto Estadounidense de Reumatologa): rheumatology.org Comunquese con un mdico si: Tiene enrojecimiento, hinchazn o sensacin de calor que empeora en una articulacin. Tiene fiebre y siente dolor en la articulacin o el msculo. Presenta una erupcin cutnea. Tiene dificultad para X[employer] Energy cotidianas. Siente dolor que empeora y no se alivia con los analgsicos. Esta informacin no tiene Theme park manager el consejo del mdico. Asegrese de hacerle al mdico cualquier pregunta que tenga. Document Revised: 09/09/2022 Document Reviewed: 09/09/2022 Elsevier Patient Education  2024 Elsevier Inc.   Edwina Barth, MD [facility]

## 2023-11-03 ENCOUNTER — Telehealth: Payer: Self-pay | Admitting: Emergency Medicine

## 2023-11-03 ENCOUNTER — Other Ambulatory Visit: Payer: Self-pay

## 2023-11-03 NOTE — Telephone Encounter (Signed)
    Prescription Request  11/03/2023  LOV: 10/27/2023  What is the name of the medication or equipment? meloxicam (MOBIC) 15 MG tablet   Have you contacted your pharmacy to request a refill? No   Which pharmacy would you like this sent to?  [id number] - [place] ([place]), [place] - [address] [place] ([place]) [place] [address] Phone: [phone] Fax: [phone]    Patient notified that their request is being sent to the clinical staff for review and that they should receive a response within 2 business days.

## 2023-11-05 ENCOUNTER — Other Ambulatory Visit: Payer: Self-pay

## 2023-11-05 ENCOUNTER — Other Ambulatory Visit: Payer: Self-pay | Admitting: Radiology

## 2023-11-05 DIAGNOSIS — M159 Polyosteoarthritis, unspecified: Secondary | ICD-10-CM

## 2023-11-05 MED ORDER — MELOXICAM 15 MG PO TABS: 15.0000 mg | ORAL_TABLET | Freq: Every day | ORAL | 0 refills | Status: AC

## 2023-11-26 ENCOUNTER — Ambulatory Visit (INDEPENDENT_AMBULATORY_CARE_PROVIDER_SITE_OTHER): Payer: Self-pay | Admitting: Emergency Medicine

## 2023-11-26 ENCOUNTER — Other Ambulatory Visit: Payer: Self-pay

## 2023-11-26 ENCOUNTER — Encounter: Payer: Self-pay | Admitting: Emergency Medicine

## 2023-11-26 VITALS — BP 132/78 | HR 61 | Temp 97.7°F | Ht 65.0 in | Wt 164.0 lb

## 2023-11-26 DIAGNOSIS — M48062 Spinal stenosis, lumbar region with neurogenic claudication: Secondary | ICD-10-CM

## 2023-11-26 DIAGNOSIS — M159 Polyosteoarthritis, unspecified: Secondary | ICD-10-CM

## 2023-11-26 DIAGNOSIS — E1169 Type 2 diabetes mellitus with other specified complication: Secondary | ICD-10-CM

## 2023-11-26 DIAGNOSIS — E785 Hyperlipidemia, unspecified: Secondary | ICD-10-CM

## 2023-11-26 MED ORDER — MELOXICAM 15 MG PO TABS
15.0000 mg | ORAL_TABLET | Freq: Every day | ORAL | 3 refills | Status: DC | PRN
Start: 2023-11-26 — End: 2024-02-24
  Filled 2023-11-26: qty 30, 30d supply, fill #0
  Filled 2024-01-04: qty 30, 30d supply, fill #1

## 2023-11-26 NOTE — Patient Instructions (Signed)
Mantenimiento de la salud despus de los 65 aos de edad Health Maintenance After Age 68 Despus de los 65 aos de edad, corre un riesgo mayor de padecer ciertas enfermedades e infecciones a largo plazo, como tambin de sufrir lesiones por cadas. Las cadas son la causa principal de las fracturas de huesos y lesiones en la cabeza de personas mayores de 65 aos de edad. Recibir cuidados preventivos de forma regular puede ayudarlo a mantenerse saludable y en buen estado. Los cuidados preventivos incluyen realizarse anlisis de forma regular y realizar cambios en el estilo de vida segn las recomendaciones del mdico. Converse con el mdico sobre lo siguiente: Las pruebas de deteccin y los anlisis que debe realizarse. Una prueba de deteccin es un estudio que se para detectar la presencia de una enfermedad cuando no tiene sntomas. Un plan de dieta y ejercicios adecuado para usted. Qu debo saber sobre las pruebas de deteccin y los anlisis para prevenir cadas? Realizarse pruebas de deteccin y anlisis es la mejor manera de detectar un problema de salud de forma temprana. El diagnstico y tratamiento tempranos le brindan la mejor oportunidad de controlar las afecciones mdicas que son comunes despus de los 65 aos de edad. Ciertas afecciones y elecciones de estilo de vida pueden hacer que sea ms propenso a sufrir una cada. El mdico puede recomendarle lo siguiente: Controles regulares de la visin. Una visin deficiente y afecciones como las cataratas pueden hacer que sea ms propenso a sufrir una cada. Si usa lentes, asegrese de obtener una receta actualizada si su visin cambia. Revisin de medicamentos. Revise regularmente con el mdico todos los medicamentos que toma, incluidos los medicamentos de venta libre. Consulte al mdico sobre los efectos secundarios que pueden hacer que sea ms propenso a sufrir una cada. Informe al mdico si alguno de los medicamentos que toma lo hace sentir mareado o  somnoliento. Controles de fuerza y equilibrio. El mdico puede recomendar ciertos estudios para controlar su fuerza y equilibrio al estar de pie, al caminar o al cambiar de posicin. Examen de los pies. El dolor y el adormecimiento en los pies, como tambin no utilizar el calzado adecuado, pueden hacer que sea ms propenso a sufrir una cada. Pruebas de deteccin, que incluyen las siguientes: Pruebas de deteccin para la osteoporosis. La osteoporosis es una afeccin que hace que los huesos se tornen ms dbiles y se quiebren con ms facilidad. Pruebas de deteccin para la presin arterial. Los cambios en la presin arterial y los medicamentos para controlar la presin arterial pueden hacerlo sentir mareado. Prueba de deteccin de la depresin. Es ms probable que sufra una cada si tiene miedo a caerse, se siente deprimido o se siente incapaz de realizar actividades que sola hacer. Prueba de deteccin de consumo de alcohol. Beber demasiado alcohol puede afectar su equilibrio y puede hacer que sea ms propenso a sufrir una cada. Siga estas indicaciones en su casa: Estilo de vida No beba alcohol si: Su mdico le indica no hacerlo. Si bebe alcohol: Limite la cantidad que bebe a lo siguiente: De 0 a 1 medida por da para las mujeres. De 0 a 2 medidas por da para los hombres. Sepa cunta cantidad de alcohol hay en las bebidas que toma. En los [place], una medida equivale a una botella de cerveza de 12 oz (355 ml), un vaso de vino de 5 oz (148 ml) o un vaso de una bebida alcohlica de alta graduacin de 1 oz (44 ml). No consuma ningn producto que   contenga nicotina o tabaco. Estos productos incluyen cigarrillos, tabaco para mascar y aparatos de vapeo, como los cigarrillos electrnicos. Si necesita ayuda para dejar de consumir estos productos, consulte al mdico. Actividad  Siga un programa de ejercicio regular para mantenerse en forma. Esto lo ayudar a mantener el equilibrio. Consulte al  mdico qu tipos de ejercicios son adecuados para usted. Si necesita un bastn o un andador, selo segn las recomendaciones del mdico. Utilice calzado con buen apoyo y suela antideslizante. Seguridad  Retire los objetos que puedan causar tropiezos tales como alfombras, cables u obstculos. Instale equipos de seguridad, como barras para sostn en los baos y barandas de seguridad en las escaleras. Mantenga las habitaciones y los pasillos bien iluminados. Indicaciones generales Hable con el mdico sobre sus riesgos de sufrir una cada. Infrmele a su mdico si: Se cae. Asegrese de informarle a su mdico acerca de todas las cadas, incluso aquellas que parecen ser menores. Se siente mareado, cansado (tiene fatiga) o siente que pierde el equilibrio. Use los medicamentos de venta libre y los recetados solamente como se lo haya indicado el mdico. Estos incluyen suplementos. Siga una dieta sana y mantenga un peso saludable. Una dieta saludable incluye productos lcteos descremados, carnes bajas en contenido de grasa (magras), fibra de granos enteros, frijoles y muchas frutas y verduras. Mantngase al da con las vacunas. Realcese los estudios de rutina de la salud, dentales y de la vista. Resumen Tener un estilo de vida saludable y recibir cuidados preventivos pueden ayudar a promover la salud y el bienestar despus de los 65 aos de edad. Realizarse pruebas de deteccin y anlisis es la mejor manera de detectar un problema de salud de forma temprana y ayudarlo a evitar una cada. El diagnstico y tratamiento tempranos le brindan la mejor oportunidad de controlar las afecciones mdicas ms comunes en las personas mayores de 65 aos de edad. Las cadas son la causa principal de las fracturas de huesos y lesiones en la cabeza de personas mayores de 65 aos de edad. Tome precauciones para evitar una cada en su casa. Trabaje con el mdico para saber qu cambios que puede hacer para mejorar su salud y  bienestar, y para prevenir las cadas. Esta informacin no tiene como fin reemplazar el consejo del mdico. Asegrese de hacerle al mdico cualquier pregunta que tenga. Document Revised: 05/15/2021 Document Reviewed: 05/15/2021 Elsevier Patient Education  2024 Elsevier Inc.  

## 2023-11-26 NOTE — Assessment & Plan Note (Signed)
Recent lumbar spine MRI shows severe spinal stenosis Needs evaluation by spinal surgeon Needs to follow-up with present orthopedist Pain management discussed Recommend to start gabapentin 300 mg twice a day Gabapentin helping

## 2023-11-26 NOTE — Assessment & Plan Note (Signed)
History of osteoarthritis.  Mild chronic deformities seen in both hands.  Recommend meloxicam 15 mg daily for 2 weeks Monitor symptoms for improvement. Symptoms much improved on meloxicam Advised to continue 50 mg daily as needed Advised to stay well-hydrated and avoid heavy lifting, pushing or pulling.

## 2023-11-26 NOTE — Progress Notes (Signed)
Justin Stein 67 y.o.   Chief Complaint  Patient presents with   Follow-up    4 wk f/u for Dm     HISTORY OF PRESENT ILLNESS: This is a 67 y.o. male A1A here for 4-week follow-up Was advised to stop atorvastatin due to muscle cramps.  Much improved. Well-controlled diabetes.  History of osteoarthritis.  Meloxicam helping. Has no complaints or medical concerns today.  HPI   Prior to Admission medications   Medication Sig Start Date End Date Taking? Authorizing Provider  atorvastatin (LIPITOR) 40 MG tablet Take 1 tablet (40 mg total) by mouth daily. 06/10/23 12/07/23 Yes Calhoun Reichardt, Eilleen Kempf, MD  Camphor-Eucalyptus-Menthol [facility] VAPORUB EX) Apply 1 Application topically daily as needed (pain).   Yes [provider]  cyclobenzaprine (FLEXERIL) 10 MG tablet Take 1 tablet (10 mg total) by mouth 3 (three) times daily as needed for muscle spasms. 07/04/23  Yes Cosentino, Talmadge Chad, PA-C  gabapentin (NEURONTIN) 300 MG capsule Take 1 capsule (300 mg total) by mouth 3 (three) times daily. 09/30/23  Yes   glipiZIDE (GLUCOTROL) 5 MG tablet Take 1 tablet (5 mg total) by mouth 2 (two) times daily before a meal. 06/10/23 06/04/24 Yes Ainslee Sou, Eilleen Kempf, MD  metFORMIN (GLUCOPHAGE-XR) 500 MG 24 hr tablet Take 2 tablets (1,000 mg total) by mouth 2 (two) times daily with a meal. 06/10/23  Yes Vincenza Dail, Eilleen Kempf, MD  methocarbamol (ROBAXIN) 750 MG tablet Take 1 tablet (750 mg total) by mouth every 6 (six) hours. 10/13/23  Yes   blood glucose meter kit and supplies KIT Dispense based on patient and insurance preference. Use up to four times daily as directed. (FOR ICD-9 250.00, 250.01). Patient not taking: Reported on 06/10/2023 01/18/21   Rushie Chestnut, PA-C  HYDROcodone-acetaminophen [facility]) 10-325 MG tablet Take 1 tablet by mouth every 4 (four) hours as needed for severe pain. 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) 02/24/2012   Carpal tunnel syndrome of left wrist 02/24/2012   Dyslipidemia associated with type 2 diabetes mellitus (HCC) 01/09/2012   Neuropathy 01/09/2012   Diabetes mellitus type II, uncontrolled 05/28/2009    Past Medical History:  Diagnosis Date   Diabetes mellitus without complication (HCC)     Past Surgical History:  Procedure Laterality Date   LUMBAR LAMINECTOMY/DECOMPRESSION MICRODISCECTOMY Bilateral 07/03/2023   Procedure: BILATERAL LUMBAR TWO-THREE, LUMBAR THREE-FOUR SUBLAMINAR DECOMPRESSION WITH LEFT LUMBAR THREE-FOUR MICRODISKECTOMY;  Surgeon: Donalee Citrin, MD;  Location: [facility] OR;  Service: Neurosurgery;  Laterality: Bilateral;  3C   NO PAST SURGERIES      Social History   Socioeconomic History   Marital status: Single    Spouse name: Not on file   Number of children: Not on file   Years of education: Not on file   Highest education level: Not on file  Occupational History   Not on file  Tobacco Use   Smoking status: Former   Smokeless tobacco: Never  Vaping Use   Vaping status: Never Used  Substance and Sexual Activity   Alcohol use: No   Drug use: No   Sexual activity: Not on file  Other Topics Concern   Not on file  Social History Narrative   Not on file   Social Determinants of Health   Financial Resource Strain: Not on file  Food Insecurity: Not on file  Transportation Needs: Not on file  Physical Activity: Not on file  Stress: Not on file  Social Connections: Not on file  Intimate Partner Violence: Not on file    History reviewed. No pertinent family history.   Review of Systems   Constitutional: Negative.  Negative for chills and fever.  HENT: Negative.  Negative for congestion and sore throat.   Respiratory: Negative.  Negative for cough and shortness of breath.   Cardiovascular: Negative.  Negative for chest pain and palpitations.  Gastrointestinal:  Negative for abdominal pain, nausea and vomiting.  Genitourinary: Negative.  Negative for dysuria and hematuria.  Musculoskeletal:  Positive for joint pain.  Skin: Negative.  Negative for rash.  Neurological: Negative.  Negative for dizziness and headaches.  All other systems reviewed and are negative.   Today's Vitals   11/26/23 0801  BP: 132/78  Pulse: 61  Temp: 97.7 F (36.5 C)  TempSrc: Oral  SpO2: 96%  Weight: 164 lb (74.4 kg)  Height: 5\' 5"  (1.651 m)   Body mass index is 27.29 kg/m.   Physical Exam Vitals reviewed.  Constitutional:      Appearance: Normal appearance.  HENT:     Head: Normocephalic.  Eyes:     Extraocular Movements: Extraocular movements intact.  Cardiovascular:     Rate and Rhythm: Normal rate.  Pulmonary:     Effort: Pulmonary effort is normal.  Skin:    General: Skin is warm and dry.  Neurological:     Mental Status: He is alert and oriented to person, place, and time.  Psychiatric:        Mood and Affect: Mood normal.        Behavior: Behavior normal.      ASSESSMENT & PLAN: Problem List Items Addressed This Visit       Endocrine   Dyslipidemia associated with type 2 diabetes mellitus (HCC) - Primary         Lab Results  Component Value Date    HGBA1C 6.8 (A) 09/28/2023  Well-controlled diabetes Has been on atorvastatin for a while.  May be contributing to generalized muscle achiness.  Advised to stop atorvastatin for 2 weeks and monitor symptoms. Symptoms much improved after stopping atorvastatin Recommend to take it once or twice a week Diet and nutrition discussed Follow-up in 3 months        Musculoskeletal and Integument   Generalized  osteoarthritis of multiple sites    History of osteoarthritis.  Mild chronic deformities seen in both hands.  Recommend meloxicam 15 mg daily for 2 weeks Monitor symptoms for improvement. Symptoms much improved on meloxicam Advised to continue 50 mg daily as needed Advised to stay well-hydrated and avoid heavy lifting, pushing or pulling.      Relevant Medications   meloxicam (MOBIC) 15 MG tablet     Other   Spinal stenosis of lumbar region with neurogenic claudication    Recent lumbar spine MRI shows severe spinal stenosis Needs evaluation by spinal surgeon Needs to follow-up with present orthopedist Pain management discussed Recommend to start gabapentin 300 mg twice a day Gabapentin helping      Relevant Medications   meloxicam (MOBIC) 15 MG tablet   Patient Instructions  Mantenimiento de la salud despus de los 65 aos de edad  Health Maintenance After Age 22 Despus de los 90 aos de edad, corre un riesgo mayor de Film/video editor enfermedades e infecciones a Air cabin crew, como tambin de sufrir lesiones por cadas. Las cadas son la causa principal de las fracturas de huesos y lesiones en la cabeza de personas mayores de 65 aos de edad. Recibir cuidados preventivos de forma regular puede ayudarlo a mantenerse saludable y en buen [place]. Los cuidados preventivos incluyen realizarse anlisis de forma regular y Forensic psychologist en el estilo de vida segn las recomendaciones del mdico. Converse con el mdico sobre lo siguiente: Las pruebas de deteccin y los anlisis que debe International aid/development worker. Una prueba de deteccin es un estudio que se para Engineer, manufacturing la presencia de una enfermedad cuando no tiene sntomas. Un plan de dieta y ejercicios adecuado para usted. Qu debo saber sobre las pruebas de deteccin y los anlisis para prevenir cadas? Realizarse pruebas de deteccin y [employer] es la mejor manera de Engineer, manufacturing un problema de salud de forma temprana. El diagnstico y tratamiento tempranos  le brindan la mejor oportunidad de Chief Operating Officer las afecciones mdicas que son comunes despus de los 65 aos de edad. Ciertas afecciones y elecciones de estilo de vida pueden hacer que sea ms propenso a sufrir Engineer, site. El mdico puede recomendarle lo siguiente: Controles regulares de la visin. Una visin deficiente y afecciones como las cataratas pueden hacer que sea ms propenso a sufrir Engineer, site. Si [place] lentes, asegrese de obtener una receta actualizada si su visin cambia. Revisin de medicamentos. Revise regularmente con el mdico todos los medicamentos que toma, incluidos los medicamentos de [place]. Consulte al [employer] efectos secundarios que pueden hacer que sea ms propenso a sufrir Engineer, site. Informe al mdico si alguno de los medicamentos que toma lo hace sentir mareado o somnoliento. Controles de fuerza y equilibrio. El mdico puede recomendar ciertos estudios para controlar su fuerza y equilibrio al estar de pie, al caminar o al cambiar de posicin. Examen de los pies. El dolor y Materials engineer en los pies, como tambin no utilizar el calzado [place], pueden hacer que sea ms propenso a sufrir Engineer, site. Pruebas de deteccin, que incluyen las siguientes: Pruebas de deteccin para la osteoporosis. La osteoporosis es una afeccin que hace que los huesos se tornen ms dbiles y se quiebren con ms facilidad. Pruebas de deteccin para la presin arterial. Los cambios en la presin arterial y los medicamentos para Chief Operating Officer la presin arterial pueden hacerlo sentir mareado. Prueba de deteccin de la depresin. Es ms probable que sufra una cada si tiene miedo a caerse, se siente deprimido o se siente incapaz de Probation officer. Prueba de deteccin de consumo de alcohol. Beber demasiado alcohol puede afectar su equilibrio y puede hacer que sea ms propenso a sufrir Engineer, site. Siga estas indicaciones en su casa: Estilo de vida No beba alcohol si: Su mdico  le indica no hacerlo. Si bebe alcohol: Limite la cantidad que bebe a lo siguiente: De 0 a 1 medida por da para las mujeres. De 0 a 2 medidas por da para los hombres. Sepa cunta cantidad de alcohol hay en las bebidas que toma. En los [address], una medida equivale a una botella de cerveza de 12 oz (355 ml), un vaso de vino de 5 oz (148 ml) o un vaso de una bebida alcohlica de alta graduacin de 1 oz (44 ml). No consuma ningn producto que contenga nicotina o tabaco. Estos productos incluyen cigarrillos, tabaco para  mascar y aparatos de vapeo, como los Administrator, Civil Service. Si necesita ayuda para dejar de consumir estos productos, consulte al [employer]. Actividad  Siga un programa de ejercicio regular para mantenerse en forma. Esto lo ayudar a Radio producer equilibrio. Consulte al mdico qu tipos de ejercicios son adecuados para usted. Si necesita un bastn o un andador, selo segn las recomendaciones del mdico. Utilice calzado con buen apoyo y suela antideslizante. Seguridad  Retire los [employer] puedan causar tropiezos tales como alfombras, cables u obstculos. Instale equipos de seguridad, como barras para sostn en los baos y barandas de seguridad en las escaleras. Mantenga las habitaciones y los pasillos bien iluminados. Indicaciones generales Hable con el mdico sobre sus riesgos de sufrir una cada. Infrmele a su mdico si: Se cae. Asegrese de informarle a su mdico acerca de todas las cadas, incluso aquellas que parecen ser [employer]. Se siente mareado, cansado (tiene fatiga) o siente que pierde el equilibrio. Use los medicamentos de venta libre y los recetados solamente como se lo haya indicado el mdico. Estos incluyen suplementos. Siga una dieta sana y [place] un peso saludable. Una dieta saludable incluye productos lcteos descremados, carnes bajas en contenido de grasa ([place]), fibra de granos enteros, frijoles y [place]brada frutas y verduras. Mantngase al da con las  vacunas. Realcese los estudios de rutina de la salud, dentales y de Wellsite geologist. Resumen Tener un estilo de vida saludable y recibir cuidados preventivos pueden ayudar a Research scientist (physical sciences) salud y el bienestar despus de los 65 aos de [place]. Realizarse pruebas de deteccin y [employer] es la mejor manera de Engineer, manufacturing un problema de salud de forma temprana y Eber [place] a Automotive engineer una cada. El diagnstico y tratamiento tempranos le brindan la mejor oportunidad de Chief Operating Officer las afecciones mdicas ms comunes en las personas mayores de 65 aos de edad. Las cadas son la causa principal de las fracturas de huesos y lesiones en la cabeza de personas mayores de 65 aos de edad. Tome precauciones para evitar una cada en su casa. Trabaje con el mdico para saber qu cambios que puede hacer para mejorar su salud y [place], y para prevenir las cadas. Esta informacin no tiene Theme park manager el consejo del mdico. Asegrese de hacerle al mdico cualquier pregunta que tenga. Document Revised: 05/15/2021 Document Reviewed: 05/15/2021 Elsevier Patient Education  2024 Elsevier Inc.     Edwina Barth, MD [facility]

## 2023-11-26 NOTE — Assessment & Plan Note (Signed)
  Lab Results  Component Value Date    HGBA1C 6.8 (A) 09/28/2023  Well-controlled diabetes Has been on atorvastatin for a while.  May be contributing to generalized muscle achiness.  Advised to stop atorvastatin for 2 weeks and monitor symptoms. Symptoms much improved after stopping atorvastatin Recommend to take it once or twice a week Diet and nutrition discussed Follow-up in 3 months

## 2023-11-27 ENCOUNTER — Other Ambulatory Visit: Payer: Self-pay

## 2023-12-29 ENCOUNTER — Ambulatory Visit: Payer: Self-pay | Admitting: Emergency Medicine

## 2024-01-04 ENCOUNTER — Other Ambulatory Visit: Payer: Self-pay

## 2024-01-13 ENCOUNTER — Other Ambulatory Visit: Payer: Self-pay

## 2024-02-24 ENCOUNTER — Other Ambulatory Visit: Payer: Self-pay

## 2024-02-24 ENCOUNTER — Ambulatory Visit (INDEPENDENT_AMBULATORY_CARE_PROVIDER_SITE_OTHER): Payer: Self-pay | Admitting: Emergency Medicine

## 2024-02-24 ENCOUNTER — Encounter: Payer: Self-pay | Admitting: Emergency Medicine

## 2024-02-24 VITALS — BP 120/72 | HR 63 | Temp 97.7°F | Ht 65.0 in | Wt 160.0 lb

## 2024-02-24 DIAGNOSIS — M159 Polyosteoarthritis, unspecified: Secondary | ICD-10-CM

## 2024-02-24 DIAGNOSIS — M48062 Spinal stenosis, lumbar region with neurogenic claudication: Secondary | ICD-10-CM

## 2024-02-24 DIAGNOSIS — G629 Polyneuropathy, unspecified: Secondary | ICD-10-CM

## 2024-02-24 DIAGNOSIS — E1169 Type 2 diabetes mellitus with other specified complication: Secondary | ICD-10-CM

## 2024-02-24 DIAGNOSIS — E785 Hyperlipidemia, unspecified: Secondary | ICD-10-CM

## 2024-02-24 DIAGNOSIS — R252 Cramp and spasm: Secondary | ICD-10-CM

## 2024-02-24 DIAGNOSIS — Z7984 Long term (current) use of oral hypoglycemic drugs: Secondary | ICD-10-CM

## 2024-02-24 LAB — POCT GLYCOSYLATED HEMOGLOBIN (HGB A1C): Hemoglobin A1C: 6.7 % — AB (ref 4.0–5.6)

## 2024-02-24 MED ORDER — PREGABALIN 50 MG PO CAPS
50.0000 mg | ORAL_CAPSULE | Freq: Two times a day (BID) | ORAL | 1 refills | Status: AC
Start: 2024-02-24 — End: ?
  Filled 2024-02-24: qty 60, 30d supply, fill #0
  Filled 2024-06-01: qty 60, 30d supply, fill #1

## 2024-02-24 MED ORDER — METFORMIN HCL ER 500 MG PO TB24
1000.0000 mg | ORAL_TABLET | Freq: Two times a day (BID) | ORAL | 1 refills | Status: DC
  Filled 2024-02-24: qty 360, 90d supply, fill #0

## 2024-02-24 MED ORDER — MELOXICAM 15 MG PO TABS
15.0000 mg | ORAL_TABLET | Freq: Every day | ORAL | 3 refills | Status: DC | PRN
  Filled 2024-02-24: qty 30, 30d supply, fill #0
  Filled 2024-03-30 – 2024-04-11 (×2): qty 30, 30d supply, fill #1
  Filled 2024-06-01: qty 30, 30d supply, fill #2

## 2024-02-24 NOTE — Assessment & Plan Note (Signed)
 Multifactorial.  Osteoarthritis, spinal stenosis, diabetic neuropathy. Gabapentin not helping.  May benefit from Lyrica 50 mg We will try it daily or twice daily for 2 weeks

## 2024-02-24 NOTE — Progress Notes (Signed)
 Justin Stein 68 y.o.   Chief Complaint  Patient presents with   Follow-up    Patient here for 3 month f/u for DM. Patient c/o of leg cramps that's been going on for about a year and having a hard time sleep because of this.    HISTORY OF PRESENT ILLNESS: This is a 67 y.o. male A1A here for 39-month follow-up of diabetes and chronic medical conditions Continues to have intermittent bilateral leg cramps Gabapentin not helping Meloxicam helping osteoarthritis pain No other complaints or medical concerns today.  HPI   Prior to Admission medications   Medication Sig Start Date End Date Taking? Authorizing Provider  atorvastatin (LIPITOR) 40 MG tablet Take 1 tablet (40 mg total) by mouth daily. 06/10/23 12/07/23  Georgina Quint, MD  blood glucose meter kit and supplies KIT Dispense based on patient and insurance preference. Use up to four times daily as directed. (FOR ICD-9 250.00, 250.01). Patient not taking: Reported on 06/10/2023 01/18/21   Rushie Chestnut, PA-C  Camphor-Eucalyptus-Menthol [facility] VAPORUB EX) Apply 1 Application topically daily as needed (pain).    [provider]  cyclobenzaprine (FLEXERIL) 10 MG tablet Take 1 tablet (10 mg total) by mouth 3 (three) times daily as needed for muscle spasms. 07/04/23   Cosentino, Talmadge Chad, PA-C  gabapentin (NEURONTIN) 300 MG capsule Take 1 capsule (300 mg total) by mouth 3 (three) times daily. 09/30/23     glipiZIDE (GLUCOTROL) 5 MG tablet Take 1 tablet (5 mg total) by mouth 2 (two) times daily before a meal. 06/10/23 06/04/24  Niccole Witthuhn, Eilleen Kempf, MD  HYDROcodone-acetaminophen [facility]) 10-325 MG tablet Take 1 tablet by mouth every 4 (four) hours as needed for severe pain. Patient not taking: Reported on 09/28/2023 07/04/23   Iran Sizer, PA-C  meloxicam (MOBIC) 15 MG tablet Take 1 tablet (15 mg total) by mouth daily as needed for pain. 11/26/23   Georgina Quint, MD  metFORMIN (GLUCOPHAGE-XR) 500 MG 24 hr  tablet Take 2 tablets (1,000 mg total) by mouth 2 (two) times daily with a meal. 06/10/23   Darrien Laakso, Eilleen Kempf, MD  methocarbamol (ROBAXIN) 750 MG tablet Take 1 tablet (750 mg total) by mouth every 6 (six) hours. 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) 02/24/2012   Carpal tunnel syndrome of left wrist 02/24/2012   Dyslipidemia associated with type 2 diabetes mellitus (HCC) 01/09/2012   Neuropathy 01/09/2012   Diabetes mellitus type II, uncontrolled 05/28/2009    Past Medical History:  Diagnosis Date   Diabetes mellitus without complication (HCC)     Past Surgical History:  Procedure Laterality Date   LUMBAR LAMINECTOMY/DECOMPRESSION MICRODISCECTOMY Bilateral 07/03/2023   Procedure: BILATERAL LUMBAR TWO-THREE, LUMBAR THREE-FOUR SUBLAMINAR DECOMPRESSION WITH LEFT LUMBAR THREE-FOUR MICRODISKECTOMY;  Surgeon: Donalee Citrin, MD;  Location: [facility] OR;  Service: Neurosurgery;  Laterality: Bilateral;  3C   NO PAST SURGERIES      Social History   Socioeconomic History   Marital status: Single    Spouse name: Not on file   Number of children: Not on file   Years of education: Not on file   Highest education level: Not on file  Occupational History  Not on file  Tobacco Use   Smoking status: Former   Smokeless tobacco: Never  Vaping Use   Vaping status: Never Used  Substance and Sexual Activity   Alcohol use: No   Drug use: No   Sexual activity: Not on file  Other Topics Concern   Not on file  Social History Narrative   Not on file   Social Drivers of Health   Financial Resource Strain: Not on file  Food Insecurity: Not on file  Transportation  Needs: Not on file  Physical Activity: Not on file  Stress: Not on file  Social Connections: Not on file  Intimate Partner Violence: Not on file    No family history on file.   Review of Systems  Constitutional: Negative.  Negative for chills and fever.  HENT:  Negative for congestion and sore throat.   Respiratory: Negative.  Negative for cough and shortness of breath.   Cardiovascular: Negative.  Negative for chest pain and palpitations.  Gastrointestinal:  Negative for abdominal pain, diarrhea, nausea and vomiting.  Genitourinary: Negative.  Negative for dysuria and hematuria.  Skin: Negative.  Negative for rash.  Neurological: Negative.  Negative for dizziness and headaches.  All other systems reviewed and are negative.   Vitals:   02/24/24 0806  BP: 120/72  Pulse: 63  Temp: 97.7 F (36.5 C)  SpO2: 97%     Physical Exam Vitals reviewed.  Constitutional:      Appearance: Normal appearance.  HENT:     Head: Normocephalic.     Mouth/Throat:     Mouth: Mucous membranes are moist.     Pharynx: Oropharynx is clear.  Eyes:     Extraocular Movements: Extraocular movements intact.     Pupils: Pupils are equal, round, and reactive to light.  Cardiovascular:     Rate and Rhythm: Normal rate and regular rhythm.     Pulses: Normal pulses.     Heart sounds: Normal heart sounds.  Pulmonary:     Effort: Pulmonary effort is normal.     Breath sounds: Normal breath sounds.  Musculoskeletal:     Right lower leg: No edema.     Left lower leg: No edema.     Comments: Lower extremities: Warm to touch.  Good peripheral pulses.  Good capillary refill.  No erythema or swelling.  No signs of cellulitis.  Skin:    General: Skin is warm and dry.     Capillary Refill: Capillary refill takes less than 2 seconds.  Neurological:     General: No focal deficit present.     Mental Status: He is alert and oriented to person, place, and time.  Psychiatric:        Mood and Affect: Mood  normal.        Behavior: Behavior normal.    Results for orders placed or performed in visit on 02/24/24 (from the past 24 hours)  POCT HgB A1C     Status: Abnormal   Collection Time: 02/24/24  8:32 AM  Result Value Ref Range   Hemoglobin A1C 6.7 (A) 4.0 - 5.6 %   HbA1c POC (<> result, manual entry)     HbA1c, POC (prediabetic range)     HbA1c, POC (controlled diabetic range)       ASSESSMENT & PLAN: Problem List Items Addressed This Visit       Endocrine   Dyslipidemia associated with type 2 diabetes mellitus (HCC) - Primary   Well-controlled diabetes with hemoglobin A1c of  6.7 Continue glipizide 5 mg twice a day and metformin 1000 mg twice a day Has been on atorvastatin for a while.  May be contributing to generalized muscle achiness.  Advised to stop atorvastatin for 2 weeks and monitor symptoms. Symptoms much improved after stopping atorvastatin Recommend to take it once or twice a week Diet and nutrition discussed Follow-up in 3 months      Relevant Medications   metFORMIN (GLUCOPHAGE-XR) 500 MG 24 hr tablet   Other Relevant Orders   POCT HgB A1C (Completed)     Nervous and Auditory   Neuropathy   With intermittent severe leg cramps States gabapentin not helping        Musculoskeletal and Integument   Generalized osteoarthritis of multiple sites   Stable.  Meloxicam helps. Recommend to continue 15 mg daily as needed      Relevant Medications   meloxicam (MOBIC) 15 MG tablet     Other   Spinal stenosis of lumbar region with neurogenic claudication   Spinal surgery July 2024 for severe spinal stenosis of lumbar spine Continues to have some symptoms.  Needs follow-up with spinal surgeon      Relevant Medications   meloxicam (MOBIC) 15 MG tablet   pregabalin (LYRICA) 50 MG capsule   Bilateral leg cramps   Multifactorial.  Osteoarthritis, spinal stenosis, diabetic neuropathy. Gabapentin not helping.  May benefit from Lyrica 50 mg We will try it daily or  twice daily for 2 weeks      Relevant Medications   pregabalin (LYRICA) 50 MG capsule   Patient Instructions  Diabetes mellitus y nutricin, en adultos Diabetes Mellitus and Nutrition, Adult Si sufre de diabetes, o diabetes mellitus, es muy importante tener hbitos alimenticios saludables debido a que sus niveles de Psychologist, counselling sangre (glucosa) se ven afectados en gran medida por lo que come y bebe. [place] alimentos saludables en las cantidades correctas, aproximadamente a la misma hora todos los [place], [place] ayudar a: Chief Operating Officer su glucemia. Disminuir el riesgo de sufrir una enfermedad cardaca. Mejorar la presin arterial. Barista o mantener un peso saludable. Qu puede afectar mi plan de alimentacin? Todas las personas que sufren de diabetes son diferentes y cada una tiene necesidades diferentes en cuanto a un plan de alimentacin. El mdico puede recomendarle que trabaje con un nutricionista para elaborar el mejor plan para usted. Su plan de alimentacin puede variar segn factores como: Las caloras que necesita. Los medicamentos que toma. Su peso. Sus niveles de glucemia, presin arterial y colesterol. Su nivel de [place]. Otras afecciones que tenga, como enfermedades cardacas o renales. Cmo me afectan los carbohidratos? Los carbohidratos, o hidratos de carbono, afectan su nivel de glucemia ms que cualquier otro tipo de alimento. La ingesta de carbohidratos aumenta la cantidad de [employer]. Es importante conocer la cantidad de carbohidratos que se pueden ingerir en cada comida sin correr Surveyor, minerals. Esto es Government social research officer. Su nutricionista puede ayudarlo a calcular la cantidad de carbohidratos que debe ingerir en cada comida y en cada refrigerio. Cmo me afecta el alcohol? El alcohol puede provocar una disminucin de la glucemia (hipoglucemia), especialmente si [place] insulina o toma determinados medicamentos por va oral para la diabetes. La hipoglucemia  es una afeccin potencialmente mortal. Los sntomas de la hipoglucemia, como somnolencia, mareos y confusin, son similares a los sntomas de haber consumido demasiado alcohol. No beba alcohol si: Su mdico le indica no hacerlo. Est embarazada, puede estar embarazada o est tratando [place]  embarazada. Si bebe alcohol: Limite la cantidad que bebe a lo siguiente: De 0 a 1 medida por da para las mujeres. De 0 a 2 medidas por da para los hombres. Sepa cunta cantidad de alcohol hay en las bebidas que toma. En los [address], una medida equivale a una botella de cerveza de 12 oz (355 ml), un vaso de vino de 5 oz (148 ml) o un vaso de una bebida alcohlica de alta graduacin de 1 oz (44 ml). Mantngase hidratado bebiendo agua, refrescos dietticos o t helado sin azcar. Tenga en cuenta que los refrescos comunes, los jugos y otras bebidas para mezclar pueden contener Product/process development scientist y se deben contar como carbohidratos. Consejos para seguir Social worker las etiquetas de los alimentos Comience por leer el tamao de la porcin en la etiqueta de Informacin nutricional de los alimentos envasados y las bebidas. La cantidad de caloras, carbohidratos, grasas y otros nutrientes detallados en la etiqueta se basan en una porcin del alimento. Muchos alimentos contienen ms de una porcin por envase. Verifique la cantidad total de gramos (g) de carbohidratos totales en una porcin. Verifique la cantidad de gramos de grasas saturadas y grasas trans en una porcin. Escoja alimentos que no contengan estas grasas o que su contenido de estas sea [place]. Verifique la cantidad de miligramos (mg) de sal (sodio) en una porcin. La [employer] de las personas deben limitar la ingesta de sodio total a menos de 2300 mg Google. Siempre consulte la informacin nutricional de los alimentos etiquetados como "con bajo contenido de grasa" o "sin grasa". Estos alimentos pueden tener un mayor contenido de International aid/development worker agregada o  carbohidratos refinados, y deben evitarse. Hable con su nutricionista para identificar sus objetivos diarios en cuanto a los nutrientes mencionados en la etiqueta. Al ir de compras Evite comprar alimentos procesados, enlatados o precocidos. Estos alimentos tienden a Counselling psychologist mayor cantidad de [place], sodio y azcar agregada. Compre en la zona exterior de la tienda de comestibles. Esta es la zona donde se encuentran con mayor frecuencia las frutas y las verduras frescas, los cereales a granel, las carnes frescas y los productos lcteos frescos. Al cocinar Use mtodos de coccin a baja temperatura, como hornear, en lugar de mtodos de coccin a alta temperatura, como frer en abundante aceite. Cocine con aceites saludables, como el aceite de [place], canola o [place]. Evite cocinar con manteca, crema o carnes con alto contenido de grasa. Planificacin de las comidas Coma las comidas y los refrigerios regularmente, preferentemente a la misma hora todos [place]. Evite pasar largos perodos de tiempo sin comer. Consuma alimentos ricos en fibra, como frutas frescas, verduras, frijoles y cereales integrales. Consuma entre 4 y 6 onzas (entre 112 y 168 g) de protenas magras por da, como carnes [place], pollo, pescado, huevos o tofu. Una onza (oz) (28 g) de protena magra equivale a: 1 onza (28 g) de carne, pollo o pescado. 1 huevo.  taza (62 g) de tofu. Coma algunos alimentos por da que contengan grasas saludables, como aguacates, frutos secos, semillas y pescado. Qu alimentos debo comer? Nils Pyle Bayas. Manzanas. Naranjas. Duraznos. Damascos. Ciruelas. Uvas. Mangos. Papayas. Granadas. Kiwi. Cerezas. Verduras Verduras de [employer]ott, que incluyen [place], [place], col rizada, acelga, hojas de berza, hojas de mostaza y repollo. Remolachas. Coliflor. Brcoli. Zanahorias. Judas verdes. Tomates. Pimientos. Cebollas. Pepinos. Coles de Bruselas. Granos Granos integrales, como panes, galletas, tortillas,  cereales y pastas de salvado o integrales. Avena sin azcar. Quinua. Arroz integral o salvaje. Carnes  y otras protenas Frutos de mar. Carne de ave sin piel. Cortes magros de ave y carne de res. Tofu. Frutos secos. Semillas. Lcteos Productos lcteos sin grasa o con bajo contenido de Rex, [place], yogur y [place]. Es posible que los productos detallados arriba no constituyan una lista completa de los alimentos y las bebidas que puede tomar. Consulte a un nutricionista para obtener ms informacin. Qu alimentos debo evitar? Nils Pyle Frutas enlatadas al almbar. Verduras Verduras enlatadas. Verduras congeladas con mantequilla o salsa de crema. Granos Productos elaborados con [place] y [place], como panes, pastas, bocadillos y cereales. Evite todos los alimentos procesados. Carnes y [address] de carne con alto contenido de Holiday representative. Carne de ave con piel. Carnes empanizadas o fritas. Carne procesada. Evite las grasas saturadas. Lcteos Yogur, queso o [employer] Health. Bebidas Bebidas azucaradas, como gaseosas o t helado. Es posible que los productos que se enumeran ms [place] no constituyan una lista completa de los alimentos y las bebidas que Personnel officer. Consulte a un nutricionista para obtener ms informacin. Preguntas para hacerle al mdico Debo consultar con un especialista certificado en atencin y educacin sobre la diabetes? Es necesario que me rena con un nutricionista? A qu nmero puedo llamar si tengo preguntas? Cules son los mejores momentos para controlar la glucemia? Dnde encontrar ms informacin: American Diabetes Association (Asociacin Estadounidense de la Diabetes): diabetes.org Academy of Nutrition and Dietetics (Academia de Nutricin y Pension scheme manager): eatright.Dana Corporation of Diabetes and Digestive and Kidney Diseases [employer] Company de la Diabetes y las Enfermedades Digestivas y Renales): [email or web address].si Association of Diabetes  Care & Education Specialists (Asociacin de Especialistas en Atencin y Educacin sobre la Diabetes): diabeteseducator.org Resumen Es importante tener hbitos alimenticios saludables debido a que sus niveles de Psychologist, counselling sangre (glucosa) se ven afectados en gran medida por lo que come y bebe. Es importante consumir alcohol con prudencia. Un plan de comidas saludable lo ayudar a controlar la glucosa en sangre y a reducir el riesgo de enfermedades cardacas. El mdico puede recomendarle que trabaje con un nutricionista para elaborar el mejor plan para usted. Esta informacin no tiene Theme park manager el consejo del mdico. Asegrese de hacerle al mdico cualquier pregunta que tenga. Document Revised: 08/15/2020 Document Reviewed: 08/15/2020 Elsevier Patient Education  2024 Elsevier Inc.    Edwina Barth, MD [facility]

## 2024-02-24 NOTE — Patient Instructions (Signed)
 Diabetes mellitus y nutricin, en adultos Diabetes Mellitus and Nutrition, Adult Si sufre de diabetes, o diabetes mellitus, es muy importante tener hbitos alimenticios saludables debido a que sus niveles de Psychologist, counselling sangre (glucosa) se ven afectados en gran medida por lo que come y bebe. [place] alimentos saludables en las cantidades correctas, aproximadamente a la misma hora todos los [place], [place] ayudar a: Chief Operating Officer su glucemia. Disminuir el riesgo de sufrir una enfermedad cardaca. Mejorar la presin arterial. Barista o mantener un peso saludable. Qu puede afectar mi plan de alimentacin? Todas las personas que sufren de diabetes son diferentes y cada una tiene necesidades diferentes en cuanto a un plan de alimentacin. El mdico puede recomendarle que trabaje con un nutricionista para elaborar el mejor plan para usted. Su plan de alimentacin puede variar segn factores como: Las caloras que necesita. Los medicamentos que toma. Su peso. Sus niveles de glucemia, presin arterial y colesterol. Su nivel de [place]. Otras afecciones que tenga, como enfermedades cardacas o renales. Cmo me afectan los carbohidratos? Los carbohidratos, o hidratos de carbono, afectan su nivel de glucemia ms que cualquier otro tipo de alimento. La ingesta de carbohidratos aumenta la cantidad de [employer]. Es importante conocer la cantidad de carbohidratos que se pueden ingerir en cada comida sin correr Surveyor, minerals. Esto es Government social research officer. Su nutricionista puede ayudarlo a calcular la cantidad de carbohidratos que debe ingerir en cada comida y en cada refrigerio. Cmo me afecta el alcohol? El alcohol puede provocar una disminucin de la glucemia (hipoglucemia), especialmente si [place] insulina o toma determinados medicamentos por va oral para la diabetes. La hipoglucemia es una afeccin potencialmente mortal. Los sntomas de la hipoglucemia, como somnolencia, mareos y confusin, son  similares a los sntomas de haber consumido demasiado alcohol. No beba alcohol si: Su mdico le indica no hacerlo. Est embarazada, puede estar embarazada o est tratando de [place]. Si bebe alcohol: Limite la cantidad que bebe a lo siguiente: De 0 a 1 medida por da para las mujeres. De 0 a 2 medidas por da para los hombres. Sepa cunta cantidad de alcohol hay en las bebidas que toma. En los [address], una medida equivale a una botella de cerveza de 12 oz (355 ml), un vaso de vino de 5 oz (148 ml) o un vaso de una bebida alcohlica de alta graduacin de 1 oz (44 ml). Mantngase hidratado bebiendo agua, refrescos dietticos o t helado sin azcar. Tenga en cuenta que los refrescos comunes, los jugos y otras bebidas para mezclar pueden contener Product/process development scientist y se deben contar como carbohidratos. Consejos para seguir Social worker las etiquetas de los alimentos Comience por leer el tamao de la porcin en la etiqueta de Informacin nutricional de los alimentos envasados y las bebidas. La cantidad de caloras, carbohidratos, grasas y otros nutrientes detallados en la etiqueta se basan en una porcin del alimento. Muchos alimentos contienen ms de una porcin por envase. Verifique la cantidad total de gramos (g) de carbohidratos totales en una porcin. Verifique la cantidad de gramos de grasas saturadas y grasas trans en una porcin. Escoja alimentos que no contengan estas grasas o que su contenido de estas sea [place]. Verifique la cantidad de miligramos (mg) de sal (sodio) en una porcin. La [employer]son de las personas deben limitar la ingesta de sodio total a menos de 2300 mg Google. Siempre consulte la informacin nutricional de los alimentos etiquetados como "con bajo contenido de grasa" o "sin grasa".  Estos alimentos pueden tener un mayor contenido de International aid/development worker agregada o carbohidratos refinados, y deben evitarse. Hable con su nutricionista para identificar sus objetivos diarios en  cuanto a los nutrientes mencionados en la etiqueta. Al ir de compras Evite comprar alimentos procesados, enlatados o precocidos. Estos alimentos tienden a Counselling psychologist mayor cantidad de [place], sodio y azcar agregada. Compre en la zona exterior de la tienda de comestibles. Esta es la zona donde se encuentran con mayor frecuencia las frutas y las verduras frescas, los cereales a granel, las carnes frescas y los productos lcteos frescos. Al cocinar Use mtodos de coccin a baja temperatura, como hornear, en lugar de mtodos de coccin a alta temperatura, como frer en abundante aceite. Cocine con aceites saludables, como el aceite de [place], canola o [place]. Evite cocinar con manteca, crema o carnes con alto contenido de grasa. Planificacin de las comidas Coma las comidas y los refrigerios regularmente, preferentemente a la misma hora todos [place]. Evite pasar largos perodos de tiempo sin comer. Consuma alimentos ricos en fibra, como frutas frescas, verduras, frijoles y cereales integrales. Consuma entre 4 y 6 onzas (entre 112 y 168 g) de protenas magras por da, como carnes [place], pollo, pescado, huevos o tofu. Una onza (oz) (28 g) de protena magra equivale a: 1 onza (28 g) de carne, pollo o pescado. 1 huevo.  taza (62 g) de tofu. Coma algunos alimentos por da que contengan grasas saludables, como aguacates, frutos secos, semillas y pescado. Qu alimentos debo comer? Nils Pyle Bayas. Manzanas. Naranjas. Duraznos. Damascos. Ciruelas. Uvas. Mangos. Papayas. Granadas. Kiwi. Cerezas. Verduras Verduras de [employer], que incluyen [place], [place], col rizada, acelga, hojas de berza, hojas de mostaza y repollo. Remolachas. Coliflor. Brcoli. Zanahorias. Judas verdes. Tomates. Pimientos. Cebollas. Pepinos. Coles de Bruselas. Granos Granos integrales, como panes, galletas, tortillas, cereales y pastas de salvado o integrales. Avena sin azcar. Quinua. Arroz integral o salvaje. Carnes y otras  protenas Frutos de mar. Carne de ave sin piel. Cortes magros de ave y carne de res. Tofu. Frutos secos. Semillas. Lcteos Productos lcteos sin grasa o con bajo contenido de [place], [place], yogur y [place]. Es posible que los productos detallados arriba no constituyan una lista completa de los alimentos y las bebidas que puede tomar. Consulte a un nutricionista para obtener ms informacin. Qu alimentos debo evitar? Nils Pyle Frutas enlatadas al almbar. Verduras Verduras enlatadas. Verduras congeladas con mantequilla o salsa de crema. Granos Productos elaborados con [place] y [place], como panes, pastas, bocadillos y cereales. Evite todos los alimentos procesados. Carnes y [address] de carne con alto contenido de Holiday representative. Carne de ave con piel. Carnes empanizadas o fritas. Carne procesada. Evite las grasas saturadas. Lcteos Yogur, queso o [employer] Health. Bebidas Bebidas azucaradas, como gaseosas o t helado. Es posible que los productos que se enumeran ms [place] no constituyan una lista completa de los alimentos y las bebidas que Personnel officer. Consulte a un nutricionista para obtener ms informacin. Preguntas para hacerle al mdico Debo consultar con un especialista certificado en atencin y educacin sobre la diabetes? Es necesario que me rena con un nutricionista? A qu nmero puedo llamar si tengo preguntas? Cules son los mejores momentos para controlar la glucemia? Dnde encontrar ms informacin: American Diabetes Association (Asociacin Estadounidense de la Diabetes): diabetes.org Academy of Nutrition and Dietetics (Academia de Nutricin y Pension scheme manager): eatright.D[employer] Corporation of Diabetes and Digestive and Kidney Diseases [employer] de la Diabetes y las Enfermedades Digestivas y Renales): StageSync.si Association of Diabetes  Care & Education Specialists (Asociacin de Especialistas en Atencin y Francella Solian la Diabetes):  diabeteseducator.org Resumen Es importante tener hbitos alimenticios saludables debido a que sus niveles de Psychologist, counselling sangre (glucosa) se ven afectados en gran medida por lo que come y bebe. Es importante consumir alcohol con prudencia. Un plan de comidas saludable lo ayudar a controlar la glucosa en sangre y a reducir el riesgo de enfermedades cardacas. El mdico puede recomendarle que trabaje con un nutricionista para elaborar el mejor plan para usted. Esta informacin no tiene Theme park manager el consejo del mdico. Asegrese de hacerle al mdico cualquier pregunta que tenga. Document Revised: 08/15/2020 Document Reviewed: 08/15/2020 Elsevier Patient Education  2024 [employer].

## 2024-02-24 NOTE — Assessment & Plan Note (Signed)
 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  Needs follow-up with spinal surgeon

## 2024-02-24 NOTE — Assessment & Plan Note (Signed)
 Well-controlled diabetes with hemoglobin A1c of 6.7 Continue glipizide 5 mg twice a day and metformin 1000 mg twice a day Has been on atorvastatin for a while.  May be contributing to generalized muscle achiness.  Advised to stop atorvastatin for 2 weeks and monitor symptoms. Symptoms much improved after stopping atorvastatin Recommend to take it once or twice a week Diet and nutrition discussed Follow-up in 3 months

## 2024-02-24 NOTE — Assessment & Plan Note (Signed)
 With intermittent severe leg cramps States gabapentin not helping

## 2024-02-24 NOTE — Assessment & Plan Note (Signed)
 Stable.  Meloxicam helps. Recommend to continue 15 mg daily as needed

## 2024-03-11 ENCOUNTER — Other Ambulatory Visit: Payer: Self-pay

## 2024-03-30 ENCOUNTER — Other Ambulatory Visit: Payer: Self-pay

## 2024-04-08 ENCOUNTER — Other Ambulatory Visit: Payer: Self-pay

## 2024-04-11 ENCOUNTER — Other Ambulatory Visit: Payer: Self-pay

## 2024-05-26 ENCOUNTER — Other Ambulatory Visit: Payer: Self-pay

## 2024-05-26 ENCOUNTER — Ambulatory Visit: Payer: Self-pay | Admitting: Emergency Medicine

## 2024-05-26 ENCOUNTER — Ambulatory Visit (INDEPENDENT_AMBULATORY_CARE_PROVIDER_SITE_OTHER): Payer: Self-pay | Admitting: Emergency Medicine

## 2024-05-26 ENCOUNTER — Encounter: Payer: Self-pay | Admitting: Emergency Medicine

## 2024-05-26 VITALS — BP 118/68 | HR 57 | Temp 97.7°F | Ht 65.0 in

## 2024-05-26 DIAGNOSIS — M48062 Spinal stenosis, lumbar region with neurogenic claudication: Secondary | ICD-10-CM

## 2024-05-26 DIAGNOSIS — E1169 Type 2 diabetes mellitus with other specified complication: Secondary | ICD-10-CM

## 2024-05-26 DIAGNOSIS — M159 Polyosteoarthritis, unspecified: Secondary | ICD-10-CM

## 2024-05-26 DIAGNOSIS — Z7984 Long term (current) use of oral hypoglycemic drugs: Secondary | ICD-10-CM

## 2024-05-26 DIAGNOSIS — E785 Hyperlipidemia, unspecified: Secondary | ICD-10-CM

## 2024-05-26 LAB — LIPID PANEL
Cholesterol: 150 mg/dL (ref 0–200)
HDL: 49.5 mg/dL (ref >39.00)
LDL Cholesterol: 86 mg/dL (ref 0–99)
NonHDL: 100.53
Total CHOL/HDL Ratio: 3
Triglycerides: 72 mg/dL (ref 0.0–149.0)
VLDL: 14.4 mg/dL (ref 0.0–40.0)

## 2024-05-26 LAB — CBC WITH DIFFERENTIAL/PLATELET
Basophils Absolute: 0 10*3/uL (ref 0.0–0.1)
Basophils Relative: 0.5 % (ref 0.0–3.0)
Eosinophils Absolute: 0.1 10*3/uL (ref 0.0–0.7)
Eosinophils Relative: 2.1 % (ref 0.0–5.0)
HCT: 43.9 % (ref 39.0–52.0)
Hemoglobin: 14.9 g/dL (ref 13.0–17.0)
Lymphocytes Relative: 32 % (ref 12.0–46.0)
Lymphs Abs: 1.9 10*3/uL (ref 0.7–4.0)
MCHC: 34 g/dL (ref 30.0–36.0)
MCV: 89.3 fl (ref 78.0–100.0)
Monocytes Absolute: 0.4 10*3/uL (ref 0.1–1.0)
Monocytes Relative: 6.1 % (ref 3.0–12.0)
Neutro Abs: 3.5 10*3/uL (ref 1.4–7.7)
Neutrophils Relative %: 59.3 % (ref 43.0–77.0)
Platelets: 208 10*3/uL (ref 150.0–400.0)
RBC: 4.92 Mil/uL (ref 4.22–5.81)
RDW: 14.1 % (ref 11.5–15.5)
WBC: 5.9 10*3/uL (ref 4.0–10.5)

## 2024-05-26 LAB — COMPREHENSIVE METABOLIC PANEL WITH GFR
ALT: 30 U/L (ref 0–53)
AST: 29 U/L (ref 0–37)
Albumin: 4.5 g/dL (ref 3.5–5.2)
Alkaline Phosphatase: 70 U/L (ref 39–117)
BUN: 16 mg/dL (ref 6–23)
CO2: 26 meq/L (ref 19–32)
Calcium: 9.6 mg/dL (ref 8.4–10.5)
Chloride: 102 meq/L (ref 96–112)
Creatinine, Ser: 0.83 mg/dL (ref 0.40–1.50)
GFR: 89.97 mL/min (ref >60.00)
Glucose, Bld: 152 mg/dL — ABNORMAL HIGH (ref 70–99)
Potassium: 4.3 meq/L (ref 3.5–5.1)
Sodium: 137 meq/L (ref 135–145)
Total Bilirubin: 0.9 mg/dL (ref 0.2–1.2)
Total Protein: 7.2 g/dL (ref 6.0–8.3)

## 2024-05-26 LAB — HEMOGLOBIN A1C: Hgb A1c MFr Bld: 7.4 % — ABNORMAL HIGH (ref 4.6–6.5)

## 2024-05-26 MED ORDER — METFORMIN HCL ER 500 MG PO TB24
1000.0000 mg | ORAL_TABLET | Freq: Two times a day (BID) | ORAL | 1 refills | Status: DC
  Filled 2024-05-26: qty 360, 90d supply, fill #0

## 2024-05-26 NOTE — Patient Instructions (Signed)
 Justin Stein Spinal Stenosis  La Justin Stein ocurre cuando el conducto Stein se hace ms pequeo. El conducto Stein es el espacio que est entre los huesos de la columna (vrtebras). A medida que el conducto se achica, presiona los nervios que pasan por esa parte de la columna Stein. Esto causa dolor, debilidad o prdida de la sensibilidad (adormecimiento) en los brazos o las piernas. La Justin Stein puede afectar el cuello, la parte superior de la espalda o la parte inferior de la espalda. Cules son las causas? Esta afeccin se produce cuando algunas partes de hueso aplican presin hacia el conducto Stein. Este problema puede estar presente al nacer. Si ocurre despus del nacimiento, la causa puede ser: Engineer, mining de los huesos de la columna Stein. Habitualmente comienza entre los 50 y los 60 aos de Justin Stein. Lesin en la columna Stein. Cirugas que le hayan realizado en la columna Stein. Tumores en la columna Stein. Acumulacin de calcio en la columna Stein. Qu incrementa el riesgo? Es ms probable que desarrolle esta afeccin si: Es mayor de 50 aos de edad. Naci con un problema en la columna Stein, como la columna encorvada (escoliosis). Tiene artritis. Esta es una enfermedad de las articulaciones. Cules son los signos o sntomas? Los sntomas frecuentes de esta afeccin incluyen los siguientes: Dolor en el cuello o la espalda. El dolor puede empeorar al ponerse de pie o caminar. Problemas con las piernas o los brazos. Una pierna o un brazo puede perder sensibilidad, tener hormigueo o estar caliente o fro. Esto puede ocurrir en un brazo o una pierna, o en ambos. Dolor que va desde las nalgas hasta la parte inferior de la pierna (citica). Esto puede ocurrir en una o ambas piernas. [employer]. Pie cado. Esto ocurre cuando tiene problemas para Printmaker parte delantera del pie y lo  arrastra en el suelo cuando camina. Los sntomas graves de esta afeccin incluyen los siguientes: Problemas para defecar (hacer una deposicin) o para Geographical information systems officer. Dificultad para Sales promotion account executive. Prdida de la sensibilidad de la pierna. Incapacidad para caminar. Los sntomas podran comenzar despacio y Theme park manager con Museum/gallery conservator. En algunos casos, no hay sntomas. Cmo se trata? Para tratar el dolor y [employer] sntomas, se le puede pedir lo siguiente: Education administrator sentarse y pararse derecho. Esta es [place]. Hacer ciertos ejercicios. Bajar de [place], si es necesario. Tomar medicamentos o aplicarse vacunas. Usar un cors o un dispositivo ortopdico. Lexicographer apoyo a la espalda. En algunos casos podra necesitar [name]. Siga estas instrucciones en su casa: Control del dolor, la rigidez y la hinchazn  Sintese y prese derecho. Si tiene un dispositivo ortopdico o un cors, selo como se lo haya indicado el mdico. Mantenga un peso saludable. Hable con su mdico si necesita ayuda para bajar de peso. Si se lo indican, aplique calor en la zona afectada. Hgalo con la frecuencia que le haya indicado el mdico. Use la fuente de calor que el mdico le recomiende, como una compresa de calor hmedo o una almohadilla trmica. Coloque una toalla entre la piel y la fuente de Airline pilot. Aplique calor durante 20 a 30 minutos. Si la piel se le pone de color rojo brillante, retire Company secretary de inmediato para evitar quemaduras. El riesgo de quemaduras es mayor si no puede sentir el dolor, el calor o el fro. Electronic Data Systems ejercicios como se lo haya indicado el mdico. No haga nada que le cause dolor. Pregntele  al mdico qu actividades son seguras para usted. Es posible que Personnel officer objetos. Pregntele a su mdico cunto Database administrator. Retome sus actividades normales cuando el mdico le diga que es seguro. Instrucciones generales Use los medicamentos de venta  libre y los recetados solamente como se lo haya indicado el mdico. No fume ni consuma ningn producto que contenga nicotina o tabaco. Si necesita ayuda para dejar de fumar, consulte al mdico. Siga una dieta saludable. Coma una gran cantidad de lo siguiente: Frutas. Verduras. Cereales integrales. Protena con bajo contenido de grasa [place]). Dnde buscar ms informacin [employer] of Arthritis and Musculoskeletal and Skin Diseases (Instituto [employer] de Artritis y [place] Musculoesquelticas y Arboriculturist): www.niams.http://www.myers.net/ Comunquese con un mdico si: Sus sntomas no mejoran. Sus sntomas empeoran. Tiene fiebre. Solicite ayuda de inmediato si: Comienza a Psychiatrist cuello o en la parte superior de la espalda, o el dolor se hace mucho ms fuerte. Tiene mucho dolor y los medicamentos no lo [place]. Tiene un dolor de cabeza muy intenso. Tiene mareos. No ve bien. Vomita o tiene ganas de vomitar. Tiene estas cosas en la espalda o las piernas: Prdida de la sensibilidad nueva o que empeora. Hormigueo nuevo o que empeora. No puede controlar cuando defeca o cuando hace pis. El brazo o la pierna: Duele o se hincha. Se pone de color rojo. Se siente tibio. Estos sntomas pueden Customer service manager. Solicite ayuda de inmediato. Llame al 911. No espere a ver si los sntomas desaparecen. No conduzca por sus propios medios [employer] hospital. Resumen La Justin Stein se produce cuando el espacio que est entre los huesos de la columna se hace ms pequeo. Esta afeccin puede estar presente desde el nacimiento. La Justin Stein puede provocar dolor en el cuello, la espalda, las piernas o las [place]. El tratamiento de esta afeccin se centra en disminuir el dolor y otros sntomas. Esta informacin no tiene Theme park manager el consejo del mdico. Asegrese de hacerle al mdico cualquier pregunta que tenga. Document Revised:  04/03/2022 Document Reviewed: 04/03/2022 Elsevier Patient Education  2024 [employer].

## 2024-05-26 NOTE — Assessment & Plan Note (Signed)
 Well-controlled diabetes with hemoglobin A1c of 6.7 Continue glipizide  5 mg twice a day and metformin  1000 mg twice a day Cardiovascular risks associated with diabetes and dyslipidemia discussed Diet and nutrition discussed Follow-up in 3 months

## 2024-05-26 NOTE — Assessment & Plan Note (Signed)
 Stable.  Meloxicam helps. Recommend to continue 15 mg daily as needed

## 2024-05-26 NOTE — Progress Notes (Signed)
 Justin Stein 68 y.o.   Chief Complaint  Patient presents with   Medical Management of Chronic Issues    3 Month follow up. Notes worsening cramps even with stopping the statin. Notes chronic left arm and hip sensitivity for the last year. No injury to cause this, does have history of back issues. Currently treating with ibuprofen and advil but this does not help much    HISTORY OF PRESENT ILLNESS: This is a 68 y.o. male here for 77-month follow-up of chronic medical conditions including diabetes and dyslipidemia Still struggling with symptoms of severe spinal stenosis.  Mostly complaining of lumbar pain with numbness to left leg Had spinal surgery July 2024.  Lumbar spine MRI 2024 showed significant disease.  Still symptomatic No other complaints or medical concerns today.  HPI   Prior to Admission medications   Medication Sig Start Date End Date Taking? Authorizing Provider  Camphor-Eucalyptus-Menthol  (VICKS VAPORUB EX) Apply 1 Application topically daily as needed (pain).   Yes [provider]  glipiZIDE  (GLUCOTROL ) 5 MG tablet Take 1 tablet (5 mg total) by mouth 2 (two) times daily before a meal. 06/10/23 06/04/24 Yes Hersey Maclellan, Isidro Margo, MD  metFORMIN  (GLUCOPHAGE -XR) 500 MG 24 hr tablet Take 2 tablets (1,000 mg total) by mouth 2 (two) times daily with a meal. 02/24/24  Yes Kapono Luhn, Isidro Margo, MD  pregabalin  (LYRICA ) 50 MG capsule Take 1 capsule (50 mg total) by mouth 2 (two) times daily. 02/24/24  Yes Alawna Graybeal, Isidro Margo, MD  atorvastatin  (LIPITOR) 40 MG tablet Take 1 tablet (40 mg total) by mouth daily. 06/10/23 12/07/23  Elvira Hammersmith, MD  blood glucose meter kit and supplies KIT Dispense based on patient and insurance preference. Use up to four times daily as directed. (FOR ICD-9 250.00, 250.01). Patient not taking: Reported on 02/24/2024 01/18/21   Sharla Davis, PA-C  cyclobenzaprine  (FLEXERIL ) 10 MG tablet Take 1 tablet (10 mg total) by mouth 3 (three)  times daily as needed for muscle spasms. Patient not taking: Reported on 05/26/2024 07/04/23   Cosentino, Allison R, PA-C  HYDROcodone -acetaminophen  (NORCO) 10-325 MG tablet Take 1 tablet by mouth every 4 (four) hours as needed for severe pain. Patient not taking: Reported on 09/28/2023 07/04/23   Cosentino, Allison R, PA-C  meloxicam  (MOBIC ) 15 MG tablet Take 1 tablet (15 mg total) by mouth daily as needed for pain. Patient not taking: Reported on 05/26/2024 02/24/24   Fawna Cranmer Jose, MD  methocarbamol  (ROBAXIN ) 750 MG tablet Take 1 tablet (750 mg total) by mouth every 6 (six) hours. 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) 02/24/2012   Carpal tunnel syndrome of left wrist 02/24/2012   Dyslipidemia associated with type 2 diabetes mellitus (HCC) 01/09/2012   Neuropathy 01/09/2012   Diabetes mellitus type II, uncontrolled 05/28/2009    Past Medical History:  Diagnosis Date   Diabetes mellitus without complication (HCC)     Past Surgical History:  Procedure Laterality Date   LUMBAR LAMINECTOMY/DECOMPRESSION MICRODISCECTOMY Bilateral 07/03/2023   Procedure: BILATERAL LUMBAR TWO-THREE, LUMBAR THREE-FOUR SUBLAMINAR DECOMPRESSION WITH LEFT LUMBAR THREE-FOUR MICRODISKECTOMY;  Surgeon: Gearl Keens, MD;  Location: [facility] OR;  Service: Neurosurgery;  Laterality: Bilateral;  3C   NO PAST SURGERIES      Social History   Socioeconomic History   Marital status: Single    Spouse name: Not on file   Number of children: Not on file   Years of education: Not on file   Highest education level: Not on file  Occupational History    Not on file  Tobacco Use   Smoking status: Former   Smokeless tobacco: Never  Vaping Use   Vaping status: Never Used  Substance and Sexual Activity   Alcohol use: No   Drug use: No   Sexual activity: Not on file  Other Topics Concern   Not on file  Social History Narrative   Not on file   Social Drivers of Health   Financial Resource Strain: Not on file  Food Insecurity: Not on file  Transportation Needs: Not on file  Physical Activity: Not on file  Stress: Not on file  Social Connections: Not on file  Intimate Partner Violence: Not on file    History reviewed. No pertinent family history.   Review of Systems  Constitutional: Negative.  Negative for chills and fever.  HENT: Negative.  Negative for congestion and sore throat.   Respiratory: Negative.  Negative for cough and shortness of breath.   Cardiovascular: Negative.  Negative for chest pain and palpitations.  Gastrointestinal:  Negative for abdominal pain, diarrhea, nausea and vomiting.  Genitourinary: Negative.  Negative for dysuria and hematuria.  Musculoskeletal:  Positive for back pain.  Skin: Negative.  Negative for rash.  Neurological:  Positive for sensory change. Negative for dizziness and headaches.  All other systems reviewed and are negative.   Vitals:   05/26/24 0812  BP: 118/68  Pulse: (!) 57  Temp: 97.7 F (36.5 C)  SpO2: 99%    Physical Exam Vitals reviewed.  Constitutional:      Appearance: Normal appearance.  HENT:     Head: Normocephalic.  Eyes:     Extraocular Movements: Extraocular movements intact.  Cardiovascular:     Rate and Rhythm: Normal rate and regular rhythm.  Pulmonary:     Effort: Pulmonary effort is normal.     Breath sounds: Normal breath sounds.  Abdominal:     Palpations: Abdomen is soft.     Tenderness: There is no abdominal tenderness.  Skin:    General: Skin is warm and dry.     Capillary Refill: Capillary refill takes less than 2 seconds.  Neurological:      General: No focal deficit present.     Mental Status: He is alert and oriented to person, place, and time.  Psychiatric:        Mood and Affect: Mood normal.        Behavior: Behavior normal.      ASSESSMENT & PLAN:  Problem List Items Addressed This Visit       Endocrine   Dyslipidemia associated with type 2 diabetes mellitus (HCC)   Well-controlled diabetes with hemoglobin A1c of 6.7 Continue glipizide  5 mg twice a day and metformin  1000 mg twice a day Cardiovascular risks associated with diabetes and dyslipidemia discussed Diet and nutrition discussed Follow-up in 3 months      Relevant Medications   metFORMIN  (GLUCOPHAGE -XR) 500 MG 24 hr tablet   Other Relevant Orders   CBC with Differential/Platelet   Comprehensive metabolic panel with GFR   Hemoglobin A1c   Lipid panel     Musculoskeletal and Integument  Generalized osteoarthritis of multiple sites   Stable.  Meloxicam  helps. Recommend to continue 15 mg daily as needed        Other   Spinal stenosis of lumbar region with neurogenic claudication - Primary   Spinal surgery July 2024 Lumbar spine MRI July 2024 shows extensive significant disease including severe spinal stenosis and severe disc disease with impingement Needs follow-up with spinal surgeon. Most likely will need additional surgery Active, getting worse, and affecting quality of life      Relevant Orders   Ambulatory referral to Neurosurgery   Patient Instructions  Estenosis del conducto vertebral Spinal Stenosis  La estenosis del conducto vertebral ocurre cuando el conducto vertebral se hace ms pequeo. El conducto vertebral es el espacio que est entre los huesos de la columna (vrtebras). A medida que el conducto se achica, presiona los nervios que pasan por esa parte de la columna vertebral. Esto causa dolor, debilidad o prdida de la sensibilidad (adormecimiento) en los brazos o las piernas. La estenosis del conducto vertebral puede  afectar el cuello, la parte superior de la espalda o la parte inferior de la espalda. Cules son las causas? Esta afeccin se produce cuando algunas partes de hueso aplican presin hacia el conducto vertebral. Este problema puede estar presente al nacer. Si ocurre despus del nacimiento, la causa puede ser: Degradacin de los huesos de la columna vertebral. Habitualmente comienza entre los 50 y los 60 aos de [place]. Lesin en la columna vertebral. Cirugas que le hayan realizado en la columna vertebral. Tumores en la columna vertebral. Acumulacin de calcio en la columna vertebral. Qu incrementa el riesgo? Es ms probable que desarrolle esta afeccin si: Es mayor de 50 aos de edad. Naci con un problema en la columna vertebral, como la columna encorvada (escoliosis). Tiene artritis. Esta es una enfermedad de las articulaciones. Cules son los signos o sntomas? Los sntomas frecuentes de esta afeccin incluyen los siguientes: Dolor en el cuello o la espalda. El dolor puede empeorar al ponerse de pie o caminar. Problemas con las piernas o los brazos. Una pierna o un brazo puede perder sensibilidad, tener hormigueo o estar caliente o fro. Esto puede ocurrir en un brazo o una pierna, o en ambos. Dolor que va desde las nalgas hasta la parte inferior de la pierna (citica). Esto puede ocurrir en una o ambas piernas. [employer]. Pie cado. Esto ocurre cuando tiene problemas para Printmaker parte delantera del pie y lo arrastra en el suelo cuando camina. Los sntomas graves de esta afeccin incluyen los siguientes: Problemas para defecar (hacer una deposicin) o para Geographical information systems officer. Dificultad para Sales promotion account executive. Prdida de la sensibilidad de la pierna. Incapacidad para caminar. Los sntomas podran comenzar despacio y Theme park manager con Museum/gallery conservator. En algunos casos, no hay sntomas. Cmo se trata? Para tratar el dolor y [employer] sntomas, se le puede pedir lo siguiente: Practicar  sentarse y pararse derecho. Esta es [place]. Hacer ciertos ejercicios. Bajar de [place], si es necesario. Tomar medicamentos o aplicarse vacunas. Usar un cors o un dispositivo ortopdico. Esto le da apoyo a la espalda. En algunos casos podra necesitar Mardene Shake. Siga estas instrucciones en su casa: Control del dolor, la rigidez y la hinchazn  Sintese y prese derecho. Si tiene un dispositivo ortopdico o un cors, selo como se lo haya indicado el mdico. Mantenga un peso saludable. Hable con su mdico si necesita ayuda para bajar de peso. Si se lo indican, aplique calor en la zona afectada.  Hgalo con la frecuencia que le haya indicado el mdico. Use la fuente de calor que el mdico le recomiende, como una compresa de calor hmedo o una almohadilla trmica. Coloque una toalla entre la piel y la fuente de calor. Aplique calor durante 20 a 30 minutos. Si la piel se le pone de color rojo brillante, retire Company secretary de inmediato para evitar quemaduras. El riesgo de quemaduras es mayor si no puede sentir el dolor, el calor o el fro. [employer] ejercicios como se lo haya indicado el mdico. No haga nada que le cause dolor. Pregntele al mdico qu actividades son seguras para usted. Es posible que Personnel officer objetos. Pregntele a su mdico cunto peso puede levantar. Retome sus actividades normales cuando el mdico le diga que es seguro. Instrucciones generales Use los medicamentos de venta libre y los recetados solamente como se lo haya indicado el mdico. No fume ni consuma ningn producto que contenga nicotina o tabaco. Si necesita ayuda para dejar de fumar, consulte al mdico. Siga una dieta saludable. Coma una gran cantidad de lo siguiente: Frutas. Verduras. Cereales integrales. Protena con bajo contenido de grasa (magra). Dnde buscar ms informacin [employer] of Arthritis and Musculoskeletal and Skin Diseases ([employer]stituto [employer] de Artritis y  [place] Musculoesquelticas y Arboriculturist): www.niams.http://www.myers.net/ Comunquese con un mdico si: Sus sntomas no mejoran. Sus sntomas empeoran. Tiene fiebre. Solicite ayuda de inmediato si: Comienza a Psychiatrist cuello o en la parte superior de la espalda, o el dolor se hace mucho ms fuerte. Tiene mucho dolor y los medicamentos no lo [place]. Tiene un dolor de cabeza muy intenso. Tiene mareos. No ve bien. Vomita o tiene ganas de vomitar. Tiene estas cosas en la espalda o las piernas: Prdida de la sensibilidad nueva o que empeora. Hormigueo nuevo o que empeora. No puede controlar cuando defeca o cuando hace pis. El brazo o la pierna: Duele o se hincha. Se pone de color rojo. Se siente tibio. Estos sntomas pueden Customer service manager. Solicite ayuda de inmediato. Llame al 911. No espere a ver si los sntomas desaparecen. No conduzca por sus propios medios [employer] hospital. Resumen La estenosis del conducto vertebral se produce cuando el espacio que est entre los huesos de la columna se hace ms pequeo. Esta afeccin puede estar presente desde el nacimiento. La estenosis del conducto vertebral puede provocar dolor en el cuello, la espalda, las piernas o las [place]. El tratamiento de esta afeccin se centra en disminuir el dolor y otros sntomas. Esta informacin no tiene Theme park manager el consejo del mdico. Asegrese de hacerle al mdico cualquier pregunta que tenga. Document Revised: 04/03/2022 Document Reviewed: 04/03/2022 Elsevier Patient Education  2024 Elsevier Inc.    Justin Small, MD [facility]

## 2024-05-26 NOTE — Assessment & Plan Note (Addendum)
 Spinal surgery July 2024 Lumbar spine MRI July 2024 shows extensive significant disease including severe spinal stenosis and severe disc disease with impingement Needs follow-up with spinal surgeon. Most likely will need additional surgery Active, getting worse, and affecting quality of life

## 2024-06-01 ENCOUNTER — Other Ambulatory Visit: Payer: Self-pay

## 2024-06-02 ENCOUNTER — Other Ambulatory Visit: Payer: Self-pay

## 2024-06-06 ENCOUNTER — Other Ambulatory Visit: Payer: Self-pay

## 2024-08-30 ENCOUNTER — Ambulatory Visit: Payer: Self-pay | Admitting: Emergency Medicine

## 2024-08-30 ENCOUNTER — Encounter: Payer: Self-pay | Admitting: Emergency Medicine

## 2025-01-16 ENCOUNTER — Telehealth: Payer: Self-pay

## 2025-01-16 NOTE — Telephone Encounter (Signed)
 I have contacted the patient and and informed them delay of opening office at 10 please allow patient to reschedule for there physical

## 2025-01-16 NOTE — Telephone Encounter (Signed)
 Called the patient and spoke with the patients daughter using an interpreter. With the help of the interpreter, the daughter stated that they received a notification that the appointment was canceled without anyone calling them. I informed her that the appointment was canceled due to the weather and that the office will be opening at 10:00 AM. She requested to have her father rescheduled for 10:20 AM. The appointment was scheduled, and with the help of the interpreter, I advised her to arrive 10 minutes early.

## 2025-01-17 ENCOUNTER — Ambulatory Visit: Payer: Self-pay | Admitting: Emergency Medicine

## 2025-01-17 ENCOUNTER — Other Ambulatory Visit: Payer: Self-pay

## 2025-01-17 ENCOUNTER — Encounter: Payer: Self-pay | Admitting: Emergency Medicine

## 2025-01-17 VITALS — BP 136/72 | HR 55 | Temp 97.6°F | Ht 65.0 in | Wt 161.2 lb

## 2025-01-17 DIAGNOSIS — Z7984 Long term (current) use of oral hypoglycemic drugs: Secondary | ICD-10-CM

## 2025-01-17 DIAGNOSIS — M48062 Spinal stenosis, lumbar region with neurogenic claudication: Secondary | ICD-10-CM

## 2025-01-17 DIAGNOSIS — M5126 Other intervertebral disc displacement, lumbar region: Secondary | ICD-10-CM

## 2025-01-17 DIAGNOSIS — R252 Cramp and spasm: Secondary | ICD-10-CM

## 2025-01-17 DIAGNOSIS — E785 Hyperlipidemia, unspecified: Secondary | ICD-10-CM

## 2025-01-17 DIAGNOSIS — E1169 Type 2 diabetes mellitus with other specified complication: Secondary | ICD-10-CM

## 2025-01-17 DIAGNOSIS — M159 Polyosteoarthritis, unspecified: Secondary | ICD-10-CM

## 2025-01-17 LAB — CBC WITH DIFFERENTIAL/PLATELET
Basophils Absolute: 0 10*3/uL (ref 0.0–0.1)
Basophils Relative: 0.3 % (ref 0.0–3.0)
Eosinophils Absolute: 0.1 10*3/uL (ref 0.0–0.7)
Eosinophils Relative: 1.7 % (ref 0.0–5.0)
HCT: 44.6 % (ref 39.0–52.0)
Hemoglobin: 14.9 g/dL (ref 13.0–17.0)
Lymphocytes Relative: 28.1 % (ref 12.0–46.0)
Lymphs Abs: 2.1 10*3/uL (ref 0.7–4.0)
MCHC: 33.3 g/dL (ref 30.0–36.0)
MCV: 91.6 fl (ref 78.0–100.0)
Monocytes Absolute: 0.3 10*3/uL (ref 0.1–1.0)
Monocytes Relative: 4.2 % (ref 3.0–12.0)
Neutro Abs: 4.9 10*3/uL (ref 1.4–7.7)
Neutrophils Relative %: 65.7 % (ref 43.0–77.0)
Platelets: 246 10*3/uL (ref 150.0–400.0)
RBC: 4.87 Mil/uL (ref 4.22–5.81)
RDW: 13.7 % (ref 11.5–15.5)
WBC: 7.4 10*3/uL (ref 4.0–10.5)

## 2025-01-17 LAB — LIPID PANEL
Cholesterol: 148 mg/dL (ref 28–200)
HDL: 44.5 mg/dL
LDL Cholesterol: 90 mg/dL (ref 10–99)
NonHDL: 103.94
Total CHOL/HDL Ratio: 3
Triglycerides: 71 mg/dL (ref 10.0–149.0)
VLDL: 14.2 mg/dL (ref 0.0–40.0)

## 2025-01-17 LAB — VITAMIN B12: Vitamin B-12: 399 pg/mL (ref 211–911)

## 2025-01-17 LAB — FERRITIN: Ferritin: 59.6 ng/mL (ref 22.0–322.0)

## 2025-01-17 LAB — COMPREHENSIVE METABOLIC PANEL WITH GFR
ALT: 22 U/L (ref 3–53)
AST: 22 U/L (ref 5–37)
Albumin: 4.2 g/dL (ref 3.5–5.2)
Alkaline Phosphatase: 81 U/L (ref 39–117)
BUN: 16 mg/dL (ref 6–23)
CO2: 28 meq/L (ref 19–32)
Calcium: 9.3 mg/dL (ref 8.4–10.5)
Chloride: 103 meq/L (ref 96–112)
Creatinine, Ser: 0.76 mg/dL (ref 0.40–1.50)
GFR: 91.98 mL/min
Glucose, Bld: 139 mg/dL — ABNORMAL HIGH (ref 70–99)
Potassium: 4.3 meq/L (ref 3.5–5.1)
Sodium: 138 meq/L (ref 135–145)
Total Bilirubin: 0.5 mg/dL (ref 0.2–1.2)
Total Protein: 7 g/dL (ref 6.0–8.3)

## 2025-01-17 LAB — MICROALBUMIN / CREATININE URINE RATIO
Creatinine,U: 62.1 mg/dL
Microalb Creat Ratio: 393.5 mg/g — ABNORMAL HIGH (ref 0.0–30.0)
Microalb, Ur: 24.4 mg/dL — ABNORMAL HIGH (ref 0.7–1.9)

## 2025-01-17 LAB — POCT GLYCOSYLATED HEMOGLOBIN (HGB A1C): Hemoglobin A1C: 6.9 % — AB (ref 4.0–5.6)

## 2025-01-17 MED ORDER — GLIPIZIDE 5 MG PO TABS
5.0000 mg | ORAL_TABLET | Freq: Two times a day (BID) | ORAL | 3 refills | Status: AC
  Filled 2025-01-17: qty 180, 90d supply, fill #0
  Filled 2025-01-17: qty 170, 85d supply, fill #0

## 2025-01-17 MED ORDER — ATORVASTATIN CALCIUM 40 MG PO TABS
40.0000 mg | ORAL_TABLET | Freq: Every day | ORAL | 3 refills | Status: AC
  Filled 2025-01-17: qty 90, 90d supply, fill #0

## 2025-01-17 MED ORDER — METFORMIN HCL ER 500 MG PO TB24
1000.0000 mg | ORAL_TABLET | Freq: Two times a day (BID) | ORAL | 3 refills | Status: AC
  Filled 2025-01-17: qty 360, 90d supply, fill #0

## 2025-01-17 NOTE — Patient Instructions (Signed)
 Mantenimiento de la salud despus de los 65 aos de edad Health Maintenance After Age 70 Despus de los 65 aos de edad, corre un riesgo mayor de Film/video editor enfermedades e infecciones a largo plazo, como tambin de sufrir lesiones por cadas. Las cadas son la causa principal de las fracturas de huesos y lesiones en la cabeza de personas mayores de 65 aos de edad. Recibir cuidados preventivos de forma regular puede ayudarlo a mantenerse saludable y en buen [place]. Los cuidados preventivos incluyen realizarse anlisis de forma regular y Forensic psychologist en el estilo de vida segn las recomendaciones del mdico. Converse con el mdico sobre lo siguiente: Las pruebas de deteccin y los anlisis que debe International aid/development worker. Una prueba de deteccin es un estudio que se para Engineer, manufacturing la presencia de una enfermedad cuando no tiene sntomas. Un plan de dieta y ejercicios adecuado para usted. Qu debo saber sobre las pruebas de deteccin y los anlisis para prevenir cadas? Realizarse pruebas de deteccin y [employer] es la mejor manera de Engineer, manufacturing un problema de salud de forma temprana. El diagnstico y tratamiento tempranos le brindan la mejor oportunidad de Chief Operating Officer las afecciones mdicas que son comunes despus de los 65 aos de edad. Ciertas afecciones y elecciones de estilo de vida pueden hacer que sea ms propenso a sufrir Engineer, site. El mdico puede recomendarle lo siguiente: Controles regulares de la visin. Una visin deficiente y afecciones como las cataratas pueden hacer que sea ms propenso a sufrir Engineer, site. Si usa  lentes, asegrese de obtener una receta actualizada si su visin cambia. Revisin de medicamentos. Revise regularmente con el mdico todos los medicamentos que toma, incluidos los medicamentos de [place]. Consulte al [employer] efectos secundarios que pueden hacer que sea ms propenso a sufrir Engineer, site. Informe al mdico si alguno de los medicamentos que toma lo hace sentir mareado o  somnoliento. Controles de fuerza y equilibrio. El mdico puede recomendar ciertos estudios para controlar su fuerza y equilibrio al estar de pie, al caminar o al cambiar de posicin. Examen de los pies. El dolor y Materials engineer en los pies, como tambin no utilizar el calzado adecuado, pueden hacer que sea ms propenso a sufrir Engineer, site. Pruebas de deteccin, que incluyen las siguientes: Pruebas de deteccin para la osteoporosis. La osteoporosis es una afeccin que hace que los huesos se tornen ms dbiles y se quiebren con ms facilidad. Pruebas de deteccin para la presin arterial. Los cambios en la presin arterial y los medicamentos para Chief Operating Officer la presin arterial pueden hacerlo sentir mareado. Prueba de deteccin de la depresin. Es ms probable que sufra una cada si tiene miedo a caerse, se siente deprimido o se siente incapaz de Probation officer. Prueba de deteccin de consumo de alcohol. Beber demasiado alcohol puede afectar su equilibrio y puede hacer que sea ms propenso a sufrir Engineer, site. Siga estas indicaciones en su casa: Estilo de vida No beba alcohol si: Su mdico le indica no hacerlo. Si bebe alcohol: Limite la cantidad que bebe a lo siguiente: De 0 a 1 medida por da para las mujeres. De 0 a 2 medidas por da para los hombres. Sepa cunta cantidad de alcohol hay en las bebidas que toma. En los [address], una medida equivale a una botella de cerveza de 12 oz (355 ml), un vaso de vino de 5 oz (148 ml) o un vaso de una bebida alcohlica de alta graduacin de 1 oz (44 ml). No consuma ningn producto que  contenga nicotina o tabaco. Estos productos incluyen cigarrillos, tabaco para mascar y aparatos de vapeo, como los cigarrillos electrnicos. Si necesita ayuda para dejar de consumir estos productos, consulte al [employer]. Actividad  Siga un programa de ejercicio regular para mantenerse en forma. Esto lo ayudar a Radio producer equilibrio. Consulte al  mdico qu tipos de ejercicios son adecuados para usted. Si necesita un bastn o un andador, selo segn las recomendaciones del mdico. Utilice calzado con buen apoyo y suela antideslizante. Seguridad  Retire los [employer] puedan causar tropiezos tales como alfombras, cables u obstculos. Instale equipos de seguridad, como barras para sostn en los baos y barandas de seguridad en las escaleras. Mantenga las habitaciones y los pasillos bien iluminados. Indicaciones generales Hable con el mdico sobre sus riesgos de sufrir una cada. Infrmele a su mdico si: Se cae. Asegrese de informarle a su mdico acerca de todas las cadas, incluso aquellas que parecen ser [employer]. Se siente mareado, cansado (tiene fatiga) o siente que pierde el equilibrio. Use los medicamentos de venta libre y los recetados solamente como se lo haya indicado el mdico. Estos incluyen suplementos. Siga una dieta sana y [place] un peso saludable. Una dieta saludable incluye productos lcteos descremados, carnes bajas en contenido de grasa ([place]), fibra de granos enteros, frijoles y [place] frutas y verduras. Mantngase al da con las vacunas. Realcese los estudios de rutina de la salud, dentales y de Wellsite geologist. Resumen Tener un estilo de vida saludable y recibir cuidados preventivos pueden ayudar a Research scientist (physical sciences) salud y el bienestar despus de los 65 aos de [place]. Realizarse pruebas de deteccin y anlisis es la mejor manera de Engineer, manufacturing un problema de salud de forma temprana y ayudarlo a Automotive engineer una cada. El diagnstico y tratamiento tempranos le brindan la mejor oportunidad de Chief Operating Officer las afecciones mdicas ms comunes en las personas mayores de 65 aos de edad. Las cadas son la causa principal de las fracturas de huesos y lesiones en la cabeza de personas mayores de 65 aos de edad. Tome precauciones para evitar una cada en su casa. Trabaje con el mdico para saber qu cambios que puede hacer para mejorar su salud y  [place], y para prevenir las cadas. Esta informacin no tiene Theme park manager el consejo del mdico. Asegrese de hacerle al mdico cualquier pregunta que tenga. Document Revised: 05/15/2021 Document Reviewed: 05/15/2021 Elsevier Patient Education  2024 [employer].

## 2025-01-17 NOTE — Progress Notes (Signed)
 Justin Stein 69 y.o.   No chief complaint on file.   HISTORY OF PRESENT ILLNESS: This is a 69 y.o. male here for follow-up of multiple chronic medical conditions including diabetes and dyslipidemia Accompanied by daughter.  Overall doing well.  Has not taking glipizide  for several months Still complaining of intermittent bilateral lower leg cramps.  HPI   Prior to Admission medications  Medication Sig Start Date End Date Taking? Authorizing Provider  Camphor-Eucalyptus-Menthol  (VICKS VAPORUB EX) Apply 1 Application topically daily as needed (pain).   Yes [provider]  pregabalin  (LYRICA ) 50 MG capsule Take 1 capsule (50 mg total) by mouth 2 (two) times daily. 02/24/24  Yes Onesimo Lingard, Emil Schanz, MD  atorvastatin  (LIPITOR) 40 MG tablet Take 1 tablet (40 mg total) by mouth daily. 01/17/25 07/16/25  [facility]cell Emil Schanz, MD  blood glucose meter kit and supplies KIT Dispense based on patient and insurance preference. Use up to four times daily as directed. (FOR ICD-9 250.00, 250.01). Patient not taking: Reported on 01/17/2025 01/18/21   Tonette Lauraine HERO, PA-C  glipiZIDE  (GLUCOTROL ) 5 MG tablet Take 1 tablet (5 mg total) by mouth 2 (two) times daily before a meal. 01/17/25 01/12/26  [facility]cell Emil Schanz, MD  metFORMIN  (GLUCOPHAGE -XR) 500 MG 24 hr tablet Take 2 tablets (1,000 mg total) by mouth 2 (two) times daily with a meal. 01/17/25   [facility]cell Emil Schanz, MD    Allergies[1]  Patient Active Problem List   Diagnosis Date Noted   Generalized osteoarthritis of multiple sites 10/27/2023   Bilateral leg cramps 09/28/2023   HNP (herniated nucleus pulposus), lumbar 07/03/2023   Spinal stenosis of lumbar region with neurogenic claudication 06/10/2023   Ulnar diabetic neuropathy due to secondary diabetes mellitus (HCC) 02/24/2012   Carpal tunnel syndrome of left wrist 02/24/2012   Dyslipidemia associated with type 2 diabetes mellitus (HCC) 01/09/2012   Neuropathy 01/09/2012    Diabetes mellitus type II, uncontrolled 05/28/2009    Past Medical History:  Diagnosis Date   Diabetes mellitus without complication (HCC)     Past Surgical History:  Procedure Laterality Date   LUMBAR LAMINECTOMY/DECOMPRESSION MICRODISCECTOMY Bilateral 07/03/2023   Procedure: BILATERAL LUMBAR TWO-THREE, LUMBAR THREE-FOUR SUBLAMINAR DECOMPRESSION WITH LEFT LUMBAR THREE-FOUR MICRODISKECTOMY;  Surgeon: Onetha Kuba, MD;  Location: [facility] OR;  Service: Neurosurgery;  Laterality: Bilateral;  3C   NO PAST SURGERIES      Social History   Socioeconomic History   Marital status: Single    Spouse name: Not on file   Number of children: Not on file   Years of education: Not on file   Highest education level: Not on file  Occupational History   Not on file  Tobacco Use   Smoking status: Former   Smokeless tobacco: Never  Vaping Use   Vaping status: Never Used  Substance and Sexual Activity   Alcohol use: No   Drug use: No   Sexual activity: Not on file  Other Topics Concern   Not on file  Social History Narrative   Not on file   Social Drivers of Health   Tobacco Use: Medium Risk (08/30/2024)   Patient History    Smoking Tobacco Use: Former    Smokeless Tobacco Use: Never    Passive Exposure: Not on Actuary Strain: Not on file  Food Insecurity: Not on file  Transportation Needs: Not on file  Physical Activity: Not on file  Stress: Not on file  Social Connections: Not on file  Intimate Partner Violence:  Not on file  Depression (PHQ2-9): Low Risk (02/24/2024)   Depression (PHQ2-9)    PHQ-2 Score: 0  Alcohol Screen: Not on file  Housing: Not on file  Utilities: Not on file  Health Literacy: Not on file    No family history on file.   Review of Systems  Constitutional: Negative.  Negative for chills and fever.  HENT: Negative.  Negative for congestion and sore throat.   Respiratory: Negative.  Negative for cough and shortness of breath.   Cardiovascular:  Negative.  Negative for chest pain and palpitations.  Gastrointestinal:  Negative for abdominal pain, diarrhea, nausea and vomiting.  Genitourinary: Negative.  Negative for dysuria and hematuria.  Skin: Negative.  Negative for rash.  Neurological: Negative.  Negative for dizziness and headaches.  All other systems reviewed and are negative.   Vitals:   01/17/25 1020  BP: 136/72  Pulse: (!) 55  Temp: 97.6 F (36.4 C)  SpO2: 96%    Physical Exam Vitals reviewed.  Constitutional:      Appearance: Normal appearance.  HENT:     Head: Normocephalic.     Mouth/Throat:     Mouth: Mucous membranes are moist.     Pharynx: Oropharynx is clear.  Eyes:     Extraocular Movements: Extraocular movements intact.     Conjunctiva/sclera: Conjunctivae normal.     Pupils: Pupils are equal, round, and reactive to light.  Cardiovascular:     Rate and Rhythm: Normal rate and regular rhythm.     Pulses: Normal pulses.     Heart sounds: Normal heart sounds.  Pulmonary:     Effort: Pulmonary effort is normal.     Breath sounds: Normal breath sounds.  Abdominal:     Palpations: Abdomen is soft.     Tenderness: There is no abdominal tenderness.  Musculoskeletal:     Cervical back: No tenderness.  Lymphadenopathy:     Cervical: No cervical adenopathy.  Skin:    General: Skin is warm and dry.     Capillary Refill: Capillary refill takes less than 2 seconds.  Neurological:     General: No focal deficit present.     Mental Status: He is alert and oriented to person, place, and time.  Psychiatric:        Mood and Affect: Mood normal.        Behavior: Behavior normal.    Results for orders placed or performed in visit on 01/17/25 (from the past 24 hours)  POCT A1C     Status: Abnormal   Collection Time: 01/17/25 10:30 AM  Result Value Ref Range   Hemoglobin A1C 6.9 (A) 4.0 - 5.6 %   HbA1c POC (<> result, manual entry)     HbA1c, POC (prediabetic range)     HbA1c, POC (controlled diabetic  range)       ASSESSMENT & PLAN: Problem List Items Addressed This Visit       Endocrine   Dyslipidemia associated with type 2 diabetes mellitus (HCC) - Primary   Hemoglobin A1c today is 6.9 Cardiovascular risks associated with diabetes discussed Continue metformin  1000 mg twice a day and glipizide  5 mg twice a day Diet and nutrition discussed Benefits of exercise discussed Recommend blood work today and follow-up in 3 months      Relevant Medications   metFORMIN  (GLUCOPHAGE -XR) 500 MG 24 hr tablet   atorvastatin  (LIPITOR) 40 MG tablet   glipiZIDE  (GLUCOTROL ) 5 MG tablet   Other Relevant Orders   POCT A1C (Completed)  Comprehensive metabolic panel with GFR   CBC with Differential/Platelet   Lipid panel   Vitamin B12   Ferritin   Microalbumin / creatinine urine ratio     Musculoskeletal and Integument   HNP (herniated nucleus pulposus), lumbar   Stable chronic condition      Generalized osteoarthritis of multiple sites   Stable.  Meloxicam  helps. Recommend to continue 15 mg daily as needed        Other   Spinal stenosis of lumbar region with neurogenic claudication   Spinal surgery July 2024 Lumbar spine MRI July 2024 shows extensive significant disease including severe spinal stenosis and severe disc disease with impingement Needs follow-up with spinal surgeon. Most likely will need additional surgery Active, getting worse, and affecting quality of life No recent follow-up with neurosurgeon      Bilateral leg cramps   Multifactorial.  Osteoarthritis, spinal stenosis, diabetic neuropathy. Gabapentin  not helping.  Did try Lyrica  with some success but not taking it anymore. Will also check vitamin B12 level.  Patient on metformin  We will check CBC and ferritin for iron deficiency      Patient Instructions  Mantenimiento de la salud despus de los 65 aos de edad Health Maintenance After Age 69 Despus de los 65 aos de edad, corre un riesgo mayor de production assistant, radio enfermedades e infecciones a largo plazo, como tambin de sufrir lesiones por cadas. Las cadas son la causa principal de las fracturas de huesos y lesiones en la cabeza de personas mayores de 65 aos de edad. Recibir cuidados preventivos de forma regular puede ayudarlo a mantenerse saludable y en buen [place]. Los cuidados preventivos incluyen realizarse anlisis de forma regular y forensic psychologist en el estilo de vida segn las recomendaciones del mdico. Converse con el mdico sobre lo siguiente: Las pruebas de deteccin y los anlisis que debe international aid/development worker. Una prueba de deteccin es un estudio que se para engineer, manufacturing la presencia de una enfermedad cuando no tiene sntomas. Un plan de dieta y ejercicios adecuado para usted. Qu debo saber sobre las pruebas de deteccin y los anlisis para prevenir cadas? Realizarse pruebas de deteccin y [employer] es la mejor manera de engineer, manufacturing un problema de salud de forma temprana. El diagnstico y tratamiento tempranos le brindan la mejor oportunidad de chief operating officer las afecciones mdicas que son comunes despus de los 65 aos de edad. Ciertas afecciones y elecciones de estilo de vida pueden hacer que sea ms propenso a sufrir engineer, site. El mdico puede recomendarle lo siguiente: Controles regulares de la visin. Una visin deficiente y afecciones como las cataratas pueden hacer que sea ms propenso a sufrir engineer, site. Si usa  lentes, asegrese de obtener una receta actualizada si su visin cambia. Revisin de medicamentos. Revise regularmente con el mdico todos los medicamentos que toma, incluidos los medicamentos de [place]. Consulte al enterprise products efectos secundarios que pueden hacer que sea ms propenso a sufrir engineer, site. Informe al mdico si alguno de los medicamentos que toma lo hace sentir mareado o somnoliento. Controles de fuerza y equilibrio. El mdico puede recomendar ciertos estudios para controlar su fuerza y equilibrio al estar de pie, al  caminar o al cambiar de posicin. Examen de los pies. El dolor y materials engineer en los pies, como tambin no utilizar el calzado adecuado, pueden hacer que sea ms propenso a sufrir engineer, site. Pruebas de deteccin, que incluyen las siguientes: Pruebas de deteccin para la osteoporosis. La osteoporosis es una afeccin que hace que los [place]  se tornen ms dbiles y se quiebren con ms facilidad. Pruebas de deteccin para la presin arterial. Los cambios en la presin arterial y los medicamentos para chief operating officer la presin arterial pueden hacerlo sentir mareado. Prueba de deteccin de la depresin. Es ms probable que sufra una cada si tiene miedo a caerse, se siente deprimido o se siente incapaz de probation officer. Prueba de deteccin de consumo de alcohol. Beber demasiado alcohol puede afectar su equilibrio y puede hacer que sea ms propenso a sufrir engineer, site. Siga estas indicaciones en su casa: Estilo de vida No beba alcohol si: Su mdico le indica no hacerlo. Si bebe alcohol: Limite la cantidad que bebe a lo siguiente: De 0 a 1 medida por da para las mujeres. De 0 a 2 medidas por da para los hombres. Sepa cunta cantidad de alcohol hay en las bebidas que toma. En los [address], una medida equivale a una botella de cerveza de 12 oz (355 ml), un vaso de vino de 5 oz (148 ml) o un vaso de una bebida alcohlica de alta graduacin de 1 oz (44 ml). No consuma ningn producto que contenga nicotina o tabaco. Estos productos incluyen cigarrillos, tabaco para theatre manager y aparatos de vapeo, como los cigarrillos electrnicos. Si necesita ayuda para dejar de consumir estos productos, consulte al american express. Actividad  Siga un programa de ejercicio regular para mantenerse en forma. Esto lo ayudar a radio producer equilibrio. Consulte al mdico qu tipos de ejercicios son adecuados para usted. Si necesita un bastn o un andador, selo segn las recomendaciones del mdico. Utilice  calzado con buen apoyo y suela antideslizante. Seguridad  Retire los autonation puedan causar tropiezos tales como alfombras, cables u obstculos. Instale equipos de seguridad, como barras para sostn en los baos y barandas de seguridad en las escaleras. Mantenga las habitaciones y los pasillos bien iluminados. Indicaciones generales Hable con el mdico sobre sus riesgos de sufrir una cada. Infrmele a su mdico si: Se cae. Asegrese de informarle a su mdico acerca de todas las cadas, incluso aquellas que parecen ser liberty global. Se siente mareado, cansado (tiene fatiga) o siente que pierde el equilibrio. Use los medicamentos de venta libre y los recetados solamente como se lo haya indicado el mdico. Estos incluyen suplementos. Siga una dieta sana y [place] un peso saludable. Una dieta saludable incluye productos lcteos descremados, carnes bajas en contenido de grasa ([place]), fibra de granos enteros, frijoles y [place] frutas y verduras. Mantngase al da con las vacunas. Realcese los estudios de rutina de la salud, dentales y de wellsite geologist. Resumen Tener un estilo de vida saludable y recibir cuidados preventivos pueden ayudar a research scientist (physical sciences) salud y el bienestar despus de los 65 aos de [place]. Realizarse pruebas de deteccin y anlisis es la mejor manera de engineer, manufacturing un problema de salud de forma temprana y ayudarlo a automotive engineer una cada. El diagnstico y tratamiento tempranos le brindan la mejor oportunidad de chief operating officer las afecciones mdicas ms comunes en las personas mayores de 65 aos de edad. Las cadas son la causa principal de las fracturas de huesos y lesiones en la cabeza de personas mayores de 65 aos de edad. Tome precauciones para evitar una cada en su casa. Trabaje con el mdico para saber qu cambios que puede hacer para mejorar su salud y [place], y para prevenir las cadas. Esta informacin no tiene theme park manager el consejo del mdico. Asegrese de hacerle al mdico  cualquier pregunta que tenga. Document Revised: 05/15/2021  Document Reviewed: 05/15/2021 Elsevier Patient Education  2024 Elsevier Inc.    Emil Schaumann, MD [facility]     [1] No Known Allergies

## 2025-01-17 NOTE — Assessment & Plan Note (Signed)
 Stable.  Meloxicam helps. Recommend to continue 15 mg daily as needed

## 2025-01-17 NOTE — Assessment & Plan Note (Signed)
 Spinal surgery July 2024 Lumbar spine MRI July 2024 shows extensive significant disease including severe spinal stenosis and severe disc disease with impingement Needs follow-up with spinal surgeon. Most likely will need additional surgery Active, getting worse, and affecting quality of life No recent follow-up with neurosurgeon

## 2025-01-17 NOTE — Assessment & Plan Note (Signed)
Stable chronic condition 

## 2025-01-17 NOTE — Assessment & Plan Note (Signed)
 Multifactorial.  Osteoarthritis, spinal stenosis, diabetic neuropathy. Gabapentin  not helping.  Did try Lyrica  with some success but not taking it anymore. Will also check vitamin B12 level.  Patient on metformin  We will check CBC and ferritin for iron deficiency

## 2025-01-17 NOTE — Assessment & Plan Note (Signed)
 Hemoglobin A1c today is 6.9 Cardiovascular risks associated with diabetes discussed Continue metformin  1000 mg twice a day and glipizide  5 mg twice a day Diet and nutrition discussed Benefits of exercise discussed Recommend blood work today and follow-up in 3 months

## 2025-01-18 ENCOUNTER — Other Ambulatory Visit: Payer: Self-pay

## 2025-01-18 ENCOUNTER — Ambulatory Visit: Payer: Self-pay | Admitting: Emergency Medicine

## 2025-01-20 NOTE — Progress Notes (Signed)
 I have contacted the patient and LVM in regards to labs

## 2025-04-17 ENCOUNTER — Ambulatory Visit: Payer: Self-pay | Admitting: Emergency Medicine
